# Patient Record
Sex: Female | Born: 1970 | Race: White | Hispanic: No | Marital: Married | Smoking: Never smoker
Health system: Southern US, Community
[De-identification: ages and names within clinical notes are randomized; demographics above are authoritative.]

## PROBLEM LIST (undated history)

## (undated) DIAGNOSIS — O09219 Supervision of pregnancy with history of pre-term labor, unspecified trimester: Secondary | ICD-10-CM

## (undated) DIAGNOSIS — R011 Cardiac murmur, unspecified: Secondary | ICD-10-CM

## (undated) DIAGNOSIS — I519 Heart disease, unspecified: Secondary | ICD-10-CM

## (undated) HISTORY — DX: Heart disease, unspecified: I51.9

## (undated) HISTORY — PX: TONSILLECTOMY: SUR1361

## (undated) HISTORY — DX: Cardiac murmur, unspecified: R01.1

## (undated) HISTORY — PX: MOHS SURGERY: SHX181

## (undated) HISTORY — PX: HEMORROIDECTOMY: SUR656

## (undated) HISTORY — DX: Supervision of pregnancy with history of pre-term labor, unspecified trimester: O09.219

---
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
## 1997-05-28 ENCOUNTER — Inpatient Hospital Stay (HOSPITAL_COMMUNITY): Admission: AD | Admit: 1997-05-28 | Discharge: 1997-05-30 | Payer: Self-pay | Admitting: Obstetrics & Gynecology

## 1997-06-19 ENCOUNTER — Other Ambulatory Visit: Admission: RE | Admit: 1997-06-19 | Discharge: 1997-06-19 | Payer: Self-pay | Admitting: Obstetrics and Gynecology

## 1997-11-03 ENCOUNTER — Other Ambulatory Visit: Admission: RE | Admit: 1997-11-03 | Discharge: 1997-11-03 | Payer: Self-pay | Admitting: Obstetrics and Gynecology

## 1998-10-03 ENCOUNTER — Other Ambulatory Visit: Admission: RE | Admit: 1998-10-03 | Discharge: 1998-10-03 | Payer: Self-pay | Admitting: *Deleted

## 1999-10-10 ENCOUNTER — Other Ambulatory Visit: Admission: RE | Admit: 1999-10-10 | Discharge: 1999-10-10 | Payer: Self-pay | Admitting: *Deleted

## 2002-02-28 ENCOUNTER — Other Ambulatory Visit: Admission: RE | Admit: 2002-02-28 | Discharge: 2002-02-28 | Payer: Self-pay | Admitting: Obstetrics and Gynecology

## 2002-04-26 ENCOUNTER — Encounter: Payer: Self-pay | Admitting: Obstetrics and Gynecology

## 2002-04-26 ENCOUNTER — Ambulatory Visit (HOSPITAL_COMMUNITY): Admission: RE | Admit: 2002-04-26 | Discharge: 2002-04-26 | Payer: Self-pay | Admitting: Obstetrics and Gynecology

## 2002-09-22 ENCOUNTER — Inpatient Hospital Stay (HOSPITAL_COMMUNITY): Admission: AD | Admit: 2002-09-22 | Discharge: 2002-09-25 | Payer: Self-pay | Admitting: Obstetrics and Gynecology

## 2003-03-06 ENCOUNTER — Other Ambulatory Visit: Admission: RE | Admit: 2003-03-06 | Discharge: 2003-03-06 | Payer: Self-pay | Admitting: Obstetrics and Gynecology

## 2003-05-09 ENCOUNTER — Emergency Department (HOSPITAL_COMMUNITY): Admission: EM | Admit: 2003-05-09 | Discharge: 2003-05-09 | Payer: Self-pay | Admitting: Family Medicine

## 2003-10-03 ENCOUNTER — Encounter: Admission: RE | Admit: 2003-10-03 | Discharge: 2003-10-03 | Payer: Self-pay | Admitting: Family Medicine

## 2004-11-13 ENCOUNTER — Other Ambulatory Visit: Admission: RE | Admit: 2004-11-13 | Discharge: 2004-11-13 | Payer: Self-pay | Admitting: Obstetrics and Gynecology

## 2005-12-05 ENCOUNTER — Other Ambulatory Visit: Admission: RE | Admit: 2005-12-05 | Discharge: 2005-12-05 | Payer: Self-pay | Admitting: Obstetrics and Gynecology

## 2007-02-05 ENCOUNTER — Emergency Department (HOSPITAL_COMMUNITY): Admission: EM | Admit: 2007-02-05 | Discharge: 2007-02-05 | Payer: Self-pay | Admitting: Family Medicine

## 2009-09-27 ENCOUNTER — Ambulatory Visit: Payer: Self-pay | Admitting: Family Medicine

## 2010-04-11 NOTE — Assessment & Plan Note (Signed)
Summary: UTI   Vital Signs:  Patient Profile:   40 Years Old Female CC:      POSSIBLE UTI / rwt Height:     68 inches Weight:      181 pounds BMI:     27.62 O2 Sat:      99 % O2 treatment:    Room Air Temp:     97.5 degrees F oral Pulse rate:   88 / minute Pulse (ortho):   88 / minute Pulse rhythm:   regular Resp:     20 per minute BP sitting:   132 / 71  (right arm)  Pt. in pain?   yes    Location:   pelvis    Intensity:   7    Type:       aching  Vitals Entered By: Levonne Spiller EMT-P (September 27, 2009 11:58 AM)              Is Patient Diabetic? No      Current Allergies: No known allergies History of Present Illness History from: patient Reason for visit: see chief complaint Chief Complaint: POSSIBLE UTI / rwt History of Present Illness: Patient reports that she woke up yesterday with pain upon urination. She denies frequent UTIs. Denies back pain, f/c, n/v. + some suprapubic pressure. Has been taking AZO with some relief of pain. Has not taken it this morning.   REVIEW OF SYSTEMS Constitutional Symptoms      Denies fever, chills, night sweats, and fatigue.       Gastrointestinal       Denies stomach pain, nausea/vomiting, diarrhea, and constipation. Genitourniary       Complains of painful urination.      Denies blood or discharge from vagina and loss of urinary control.    Past History:  Past Medical History: Skin cancer, hx of  Past Surgical History: ASD repair  Family History: Father: D93 Mother: A70 afib Siblings: Brother 105 - MI  Sister 56 - skin cancer Physical Exam General appearance: well developed, well nourished, uncomfortable Chest/Lungs: no rales, wheezes, or rhonchi bilateral, breath sounds equal without effort Heart: regular rate and  rhythm, no murmur Abdomen: + suprapubic tenderness Back: no CVA tenderness Skin: no obvious rashes or lesions MSE: oriented to time, place, and person Assessment New Problems: UTI (ICD-599.0) SKIN  CANCER, HX OF (ICD-V10.83)   Plan New Medications/Changes: BACTRIM DS 800-160 MG TABS (SULFAMETHOXAZOLE-TRIMETHOPRIM) 1 by mouth two times a day x 5 days  #10 x 0, 09/27/2009, Tacey Ruiz MD  New Orders: New Patient Level II [13086] Urinalysis [81003-65000] Specimen Handling [99000]  The patient and/or caregiver has been counseled thoroughly with regard to medications prescribed including dosage, schedule, interactions, rationale for use, and possible side effects and they verbalize understanding.  Diagnoses and expected course of recovery discussed and will return if not improved as expected or if the condition worsens. Patient and/or caregiver verbalized understanding.  Prescriptions: BACTRIM DS 800-160 MG TABS (SULFAMETHOXAZOLE-TRIMETHOPRIM) 1 by mouth two times a day x 5 days  #10 x 0   Entered and Authorized by:   Tacey Ruiz MD   Signed by:   Tacey Ruiz MD on 09/27/2009   Method used:   Electronically to        [facility] (retail)       [address], [address]       [place], [place]  [address]  Ph: [phone]       Fax: [phone]   RxID:   [id number]   Orders Added: 1)  New Patient Level II [99202] 2)  Urinalysis [81003-65000] 3)  Specimen Handling [99000]

## 2010-07-26 NOTE — H&P (Signed)
   Kathleen Bowen, Kathleen Bowen                         ACCOUNT NO.:  [id number]   MEDICAL RECORD NO.:  [id number]                   PATIENT TYPE:  INP   LOCATION:  9166                                 FACILITY:  [facility]   PHYSICIAN:  Crist Fat. Rivard, M.D.              DATE OF BIRTH:  [birth year]   DATE OF ADMISSION:  09/22/2002  DATE OF DISCHARGE:                                HISTORY & PHYSICAL   HISTORY OF PRESENT ILLNESS:  This is a 40 year old gravida 3, para 1-0-1-1,  at 51 and 4/7 weeks' who presents with complaints of increased uterine  contractions.  She denies leaking or bleeding.  Reports positive fetal  movement.  Cervix was 4 cm in the office today.  Pregnancy has been followed  by the nurse midwife service and is remarkable for:  1. History of preterm labor with full term delivery.  2. Preterm labor this pregnancy.  3. History of ASD repair.  4. History of thrombocytopenia.  5. Group B strep negative.   OBSTETRICAL HISTORY:  Remarkable for a vacuum-assisted vaginal delivery in  1999 of a female infant at 8 weeks' gestation weighing 8 pounds 6 ounces  complicated by a partial third-degree laceration and preterm labor.  She had  a spontaneous abortion in May of 2003.   PAST MEDICAL HISTORY:  Remarkable for preterm labor.  Child had varicella.  History of ASD repair.   PAST SURGICAL HISTORY:  Remarkable for ASD repair in 1992 and a  tonsillectomy at age 68.   GENETIC HISTORY:  Remarkable for ASD and a daughter with low platelets.   SOCIAL HISTORY:  The patient is married to Lindzee Gouge who is involved in  support issues of the [employer].  She denies any alcohol, tobacco, or  drug use.  She works as a Designer, jewellery in the NICU.   OBJECTIVE DATA:  VITAL SIGNS:  Stable.  Afebrile.  HEENT:  Within normal limits.  NECK:  Thyroid normal, not enlarged.  CHEST:  Clear to auscultation.  HEART:  Regular rate and rhythm.  ABDOMEN:  Gravid at 39 cm, vertex.  EFM shows a  reactive fetal heart rate  with uterine contractions every 4-5 minutes.  Cervical exam was 5 cm per RN.  EXTREMITIES:  Within normal limits.   ASSESSMENT:  1. Intrauterine pregnancy at 86 and 4/7 weeks'.  2. Active labor.  3. Group B strep negative.    PLAN:  1. Admit to birthing suite, Dr. Pennie Rushing notified.  2. Routine C.N.M. orders.  3. Consider amniotomy p.r.n.     Marie L. Williams, C.N.M.                 Crist Fat Rivard, M.D.    MLW/MEDQ  D:  09/22/2002  T:  09/22/2002  Job:  [id number]

## 2010-09-01 ENCOUNTER — Inpatient Hospital Stay (INDEPENDENT_AMBULATORY_CARE_PROVIDER_SITE_OTHER)
Admission: RE | Admit: 2010-09-01 | Discharge: 2010-09-01 | Disposition: A | Discharge status: Home / Self Care | Payer: Self-pay | Source: Ambulatory Visit | Attending: Emergency Medicine | Admitting: Emergency Medicine

## 2010-09-01 ENCOUNTER — Ambulatory Visit (INDEPENDENT_AMBULATORY_CARE_PROVIDER_SITE_OTHER): Payer: BC Managed Care – PPO

## 2010-09-01 DIAGNOSIS — L02419 Cutaneous abscess of limb, unspecified: Secondary | ICD-10-CM

## 2010-09-01 DIAGNOSIS — L03119 Cellulitis of unspecified part of limb: Secondary | ICD-10-CM

## 2010-09-03 ENCOUNTER — Emergency Department (HOSPITAL_COMMUNITY): Payer: BC Managed Care – PPO

## 2010-09-03 ENCOUNTER — Observation Stay (HOSPITAL_COMMUNITY)
Admission: EM | Admit: 2010-09-03 | Discharge: 2010-09-04 | Disposition: A | Discharge status: Home / Self Care | Payer: BC Managed Care – PPO | Source: Ambulatory Visit | Attending: Emergency Medicine | Admitting: Emergency Medicine

## 2010-09-03 ENCOUNTER — Inpatient Hospital Stay (INDEPENDENT_AMBULATORY_CARE_PROVIDER_SITE_OTHER)
Admission: RE | Admit: 2010-09-03 | Discharge: 2010-09-03 | Disposition: A | Discharge status: Home / Self Care | Payer: BC Managed Care – PPO | Source: Ambulatory Visit | Attending: Family Medicine | Admitting: Family Medicine

## 2010-09-03 DIAGNOSIS — M7989 Other specified soft tissue disorders: Secondary | ICD-10-CM | POA: Insufficient documentation

## 2010-09-03 DIAGNOSIS — L02419 Cutaneous abscess of limb, unspecified: Secondary | ICD-10-CM

## 2010-09-03 DIAGNOSIS — L03119 Cellulitis of unspecified part of limb: Secondary | ICD-10-CM

## 2011-06-12 ENCOUNTER — Other Ambulatory Visit: Payer: Self-pay | Admitting: Obstetrics and Gynecology

## 2011-06-12 ENCOUNTER — Other Ambulatory Visit (INDEPENDENT_AMBULATORY_CARE_PROVIDER_SITE_OTHER): Payer: BC Managed Care – PPO

## 2011-06-12 DIAGNOSIS — N920 Excessive and frequent menstruation with regular cycle: Secondary | ICD-10-CM

## 2012-02-18 ENCOUNTER — Ambulatory Visit: Payer: BC Managed Care – PPO | Admitting: Obstetrics and Gynecology

## 2012-03-11 ENCOUNTER — Encounter: Payer: Self-pay | Admitting: Obstetrics and Gynecology

## 2012-03-16 ENCOUNTER — Encounter: Payer: Self-pay | Admitting: Obstetrics and Gynecology

## 2012-03-16 ENCOUNTER — Ambulatory Visit (INDEPENDENT_AMBULATORY_CARE_PROVIDER_SITE_OTHER): Payer: BC Managed Care – PPO | Admitting: Obstetrics and Gynecology

## 2012-03-16 VITALS — BP 110/64 | HR 64 | Ht 68.0 in | Wt 185.0 lb

## 2012-03-16 DIAGNOSIS — Z309 Encounter for contraceptive management, unspecified: Secondary | ICD-10-CM

## 2012-03-16 DIAGNOSIS — IMO0001 Reserved for inherently not codable concepts without codable children: Secondary | ICD-10-CM

## 2012-03-16 DIAGNOSIS — Z124 Encounter for screening for malignant neoplasm of cervix: Secondary | ICD-10-CM

## 2012-03-16 DIAGNOSIS — Z01419 Encounter for gynecological examination (general) (routine) without abnormal findings: Secondary | ICD-10-CM

## 2012-03-16 LAB — POCT URINE PREGNANCY: Preg Test, Ur: NEGATIVE

## 2012-03-16 MED ORDER — NORETHIN ACE-ETH ESTRAD-FE 1-20 MG-MCG(24) PO TABS: 1.0000 | ORAL_TABLET | Freq: Every day | ORAL | Status: DC

## 2012-03-16 NOTE — Progress Notes (Signed)
Subjective:    Kathleen Bowen is a 42 y.o. female, [id number], who presents for an annual exam. The patient reports irregular menses-skips periods on Loestrin 24 Fe.  Denies missed pills or interfering medications.  Menstrual cycle:   LMP: Patient's last menstrual period was 02/15/2012.             Review of Systems Pertinent items are noted in HPI. Denies pelvic pain, urinary tract symptoms, vaginitis symptoms, irregular bleeding, menopausal symptoms, change in bowel habits or rectal bleeding   Objective:    BP 110/64  Pulse 64  Ht 5\' 8"  (1.727 m)  Wt 185 lb (83.915 kg)  BMI 28.13 kg/m2  LMP 02/15/2012   Wt Readings from Last 1 Encounters:  03/16/12 185 lb (83.915 kg)   Body mass index is 28.13 kg/(m^2). General Appearance: Alert, no acute distress HEENT: Grossly normal Neck / Thyroid: Supple, no thyromegaly or cervical adenopathy Lungs: Clear to auscultation bilaterally Back: No CVA tenderness Breast Exam: No dominant masses (fibrocystic breast changes) or nodes.No dimpling, nipple retraction or discharge. Cardiovascular: Regular rate and rhythm.  Gastrointestinal: Soft, non-tender, no masses or organomegaly Pelvic Exam: EGBUS-wnl, vagina-normal rugae, cervix- without lesions or tenderness, uterus appears normal size shape and consistency, adnexae-no masses or tenderness Lymphatic Exam: Non-palpable nodes in neck, clavicular,  axillary, or inguinal regions  Skin: no rashes or abnormalities Extremities: no clubbing cyanosis or edema  Neurologic: grossly normal Psychiatric: Alert and oriented  UPT: negative   Assessment:   Routine GYN Exam   Plan:  Loestrin 24 Fe #3 cycles  1 po qd 4 refills  PAP sent  RTO 1 year or prn  Nyaira Hodgens,ELMIRAPA-C

## 2012-03-16 NOTE — Progress Notes (Signed)
Regular Periods: no COME WHEN THEY WANT TO Mammogram: no  Monthly Breast Ex.: no Exercise: yes 3 DAYS A WEEK  Tetanus < 10 years: yes Seatbelts: yes  NI. Bladder Functn.: yes Abuse at home: no  Daily BM's: no Stressful Work: yes  Healthy Diet: yes Sigmoid-Colonoscopy: NO  Calcium: no Medical problems this year: DISCUSS CYCLE   LAST PAP:12/12  ASCUS; HPV NOT DETECTED  Contraception: LOESTRIN 24  Mammogram:  N0  PCP: [facility]  PMH: NO CHANGE  FMH: NO CHANGE  Last Bone Scan: NO   PT IS MARRIED.

## 2012-03-18 LAB — PAP IG W/ RFLX HPV ASCU

## 2012-04-04 ENCOUNTER — Ambulatory Visit (INDEPENDENT_AMBULATORY_CARE_PROVIDER_SITE_OTHER): Payer: BC Managed Care – PPO | Admitting: Emergency Medicine

## 2012-04-04 ENCOUNTER — Ambulatory Visit: Payer: BC Managed Care – PPO

## 2012-04-04 VITALS — BP 120/60 | HR 57 | Temp 98.7°F | Resp 16 | Ht 68.0 in | Wt 184.4 lb

## 2012-04-04 DIAGNOSIS — S022XXA Fracture of nasal bones, initial encounter for closed fracture: Secondary | ICD-10-CM

## 2012-04-04 DIAGNOSIS — S1093XA Contusion of unspecified part of neck, initial encounter: Secondary | ICD-10-CM

## 2012-04-04 DIAGNOSIS — S0033XA Contusion of nose, initial encounter: Secondary | ICD-10-CM

## 2012-04-04 DIAGNOSIS — S0003XA Contusion of scalp, initial encounter: Secondary | ICD-10-CM

## 2012-04-04 DIAGNOSIS — S0031XA Abrasion of nose, initial encounter: Secondary | ICD-10-CM

## 2012-04-04 DIAGNOSIS — Z23 Encounter for immunization: Secondary | ICD-10-CM

## 2012-04-04 DIAGNOSIS — IMO0002 Reserved for concepts with insufficient information to code with codable children: Secondary | ICD-10-CM

## 2012-04-04 NOTE — Patient Instructions (Addendum)
Nasal Fracture  A nasal fracture is a break or crack in the bones of the nose. A minor break usually heals in a month. You often will receive black eyes from a nasal fracture. This is not a cause for concern. The black eyes will go away over 1 to 2 weeks.   DIAGNOSIS   Your caregiver may want to examine you if you are concerned about a fracture of the nose. X-rays of the nose may not show a nasal fracture even when one is present. Sometimes your caregiver must wait 1 to 5 days after the injury to re-check the nose for alignment and to take additional X-rays. Sometimes the caregiver must wait until the swelling has gone down.  TREATMENT  Minor fractures that have caused no deformity often do not require treatment. More serious fractures where bones are displaced may require surgery. This will take place after the swelling is gone. Surgery will stabilize and align the fracture.  HOME CARE INSTRUCTIONS    Put ice on the injured area.   Put ice in a plastic bag.   Place a towel between your skin and the bag.   Leave the ice on for 15 to 20 minutes, 3 to 4 times a day.   Take medications as directed by your caregiver.   Only take over-the-counter or prescription medicines for pain, discomfort, or fever as directed by your caregiver.   If your nose starts bleeding, squeeze the soft parts of the nose against the center wall while you are sitting in an upright position for 10 minutes.   Contact sports should be avoided for at least 3 to 4 weeks or as directed by your caregiver.  SEEK MEDICAL CARE IF:   Your pain increases or becomes severe.   You continue to have nosebleeds.   The shape of your nose does not return to normal within 5 days.   You have pus draining from the nose.  SEEK IMMEDIATE MEDICAL CARE IF:    You have bleeding from your nose that does not stop after 20 minutes of pinching the nostrils closed and keeping ice on the nose.   You have clear fluid draining from your nose.   You notice a  grape-like swelling on the dividing wall between the nostrils (septum). This is a collection of blood (hematoma) that must be drained to help prevent infection.   You have difficulty moving your eyes.   You have recurrent vomiting.  Document Released: 02/22/2000 Document Revised: 05/19/2011 Document Reviewed: 06/10/2010  [employer] Patient Information 2013 [employer].

## 2012-04-04 NOTE — Progress Notes (Signed)
[facility] [address] [place] [address] [phone]  Date:  04/04/2012   Name:  Kathleen Bowen   DOB:  01-03-1971   MRN:  [id number]  PCP:  Henreitta Leber, PA    Chief Complaint: Facial Injury   History of Present Illness:  Kathleen Bowen is a 42 y.o. very pleasant female patient who presents with the following:  Injured last night stepping into the bathtub when she slipped and fell.  Landed on her forehead and nose.  No LOC. No neuro or visual symptoms.  Has pain in the forehead on the right and the bridge of the nose. Had a significant nose bleed that was promptly controlled.  No neck pain.  Patient Active Problem List  Diagnosis  . SKIN CANCER, HX OF    Past Medical History  Diagnosis Date  . Heart disease   . Hx of PTL (preterm labor), current pregnancy   . Heart murmur     Past Surgical History  Procedure Date  . Asd repair 1992  . Tonsillectomy   . Mohs surgery     PRECANCEROUS SKIN REMOVED  . Hemorroidectomy     History  Substance Use Topics  . Smoking status: Never Smoker   . Smokeless tobacco: Never Used  . Alcohol Use: No    Family History  Problem Relation Age of Onset  . Skin cancer Sister   . Liver cancer Paternal Grandfather   . Alcoholism Paternal Grandfather   . Heart disease Mother     PACE MAKER  . Skin cancer Mother     ON NOSE  . Atrial fibrillation Mother   . Thrombophlebitis Daughter   . ADD / ADHD Daughter   . Hypertension Father   . Diabetes Paternal Grandmother   . Heart attack Maternal Grandmother   . Heart attack Brother   . ADD / ADHD Son     No Known Allergies  Medication list has been reviewed and updated.  Current Outpatient Prescriptions on File Prior to Visit  Medication Sig Dispense Refill  . Norethindrone Acetate-Ethinyl Estrad-FE (LOESTRIN 24 FE) 1-20 MG-MCG(24) tablet Take 1 tablet by mouth daily.  1 Package  11  . Norethindrone Acetate-Ethinyl Estrad-FE (LOESTRIN 24 FE)  1-20 MG-MCG(24) tablet Take 1 tablet by mouth daily.  3 Package  4    Review of Systems:  As per HPI, otherwise negative.    Physical Examination: Filed Vitals:   04/04/12 0925  BP: 120/60  Pulse: 57  Temp: 98.7 F (37.1 C)  Resp: 16   Filed Vitals:   04/04/12 0925  Height: 5\' 8"  (1.727 m)  Weight: 184 lb 6.4 oz (83.643 kg)   Body mass index is 28.04 kg/(m^2). Ideal Body Weight: Weight in (lb) to have BMI = 25: 164.1    GEN: WDWN, NAD, Non-toxic, Alert & Oriented x 3 HEENT: contusion right cheek and abrasion nose.  No deformity.  Tender bridge of nose, Normocephalic.  Ears and Nose: No external deformity.  TM negative.  No battle sign.  No epistaxis EXTR: No clubbing/cyanosis/edema NEURO: Normal gait.  PSYCH: Normally interactive. Conversant. Not depressed or anxious appearing.  Calm demeanor.    Assessment and Plan: Fractured nose Abrasion nose Follow up with plastics or ENT  Ice tylenol  Carmelina Dane, MD  [facility] reading (PRIMARY) by  Dr. Dareen Piano.  Fractured nose.

## 2013-10-17 ENCOUNTER — Encounter: Payer: Self-pay | Admitting: Internal Medicine

## 2013-10-17 ENCOUNTER — Ambulatory Visit (INDEPENDENT_AMBULATORY_CARE_PROVIDER_SITE_OTHER): Payer: BC Managed Care – PPO | Admitting: Internal Medicine

## 2013-10-17 VITALS — BP 118/68 | HR 71 | Ht 68.0 in | Wt 186.0 lb

## 2013-10-17 DIAGNOSIS — R42 Dizziness and giddiness: Secondary | ICD-10-CM

## 2013-10-17 DIAGNOSIS — R0602 Shortness of breath: Secondary | ICD-10-CM

## 2013-10-17 DIAGNOSIS — E785 Hyperlipidemia, unspecified: Secondary | ICD-10-CM

## 2013-10-17 LAB — BASIC METABOLIC PANEL
BUN: 11 mg/dL (ref 6–23)
CHLORIDE: 107 meq/L (ref 96–112)
CO2: 27 mEq/L (ref 19–32)
Calcium: 9.1 mg/dL (ref 8.4–10.5)
Creatinine, Ser: 0.9 mg/dL (ref 0.4–1.2)
GFR: 72.33 mL/min (ref >60.00)
Glucose, Bld: 87 mg/dL (ref 70–99)
POTASSIUM: 3.8 meq/L (ref 3.5–5.1)
SODIUM: 138 meq/L (ref 135–145)

## 2013-10-17 LAB — CBC WITH DIFFERENTIAL/PLATELET
BASOS ABS: 0 10*3/uL (ref 0.0–0.1)
BASOS PCT: 0.4 % (ref 0.0–3.0)
EOS ABS: 0.1 10*3/uL (ref 0.0–0.7)
Eosinophils Relative: 0.8 % (ref 0.0–5.0)
HCT: 42.4 % (ref 36.0–46.0)
Hemoglobin: 14.6 g/dL (ref 12.0–15.0)
LYMPHS PCT: 24.1 % (ref 12.0–46.0)
Lymphs Abs: 2.1 10*3/uL (ref 0.7–4.0)
MCHC: 34.5 g/dL (ref 30.0–36.0)
MCV: 92.5 fl (ref 78.0–100.0)
Monocytes Absolute: 0.5 10*3/uL (ref 0.1–1.0)
Monocytes Relative: 5.9 % (ref 3.0–12.0)
NEUTROS PCT: 68.8 % (ref 43.0–77.0)
Neutro Abs: 6.1 10*3/uL (ref 1.4–7.7)
PLATELETS: 179 10*3/uL (ref 150.0–400.0)
RBC: 4.58 Mil/uL (ref 3.87–5.11)
RDW: 12.7 % (ref 11.5–15.5)
WBC: 8.8 10*3/uL (ref 4.0–10.5)

## 2013-10-17 LAB — LIPID PANEL
CHOLESTEROL: 177 mg/dL (ref 0–200)
HDL: 40.4 mg/dL (ref >39.00)
LDL CALC: 112 mg/dL — AB (ref 0–99)
NonHDL: 136.6
TRIGLYCERIDES: 125 mg/dL (ref 0.0–149.0)
Total CHOL/HDL Ratio: 4
VLDL: 25 mg/dL (ref 0.0–40.0)

## 2013-10-17 LAB — TSH: TSH: 1.68 u[IU]/mL (ref 0.35–4.50)

## 2013-10-17 NOTE — Patient Instructions (Signed)
Your physician recommends that you continue on your current medications as directed. Please refer to the Current Medication list given to you today.  Your physician recommends that you return for lab work in: today  Your physician has requested that you have an echocardiogram. Echocardiography is a painless test that uses sound waves to create images of your heart. It provides your doctor with information about the size and shape of your heart and how well your heart's chambers and valves are working. This procedure takes approximately one hour. There are no restrictions for this procedure.  Your physician has recommended that you wear a 24 hour holter monitor. Holter monitors are medical devices that record the heart's electrical activity. Doctors most often use these monitors to diagnose arrhythmias. Arrhythmias are problems with the speed or rhythm of the heartbeat. The monitor is a small, portable device. You can wear one while you do your normal daily activities. This is usually used to diagnose what is causing palpitations/syncope (passing out).  Your physician recommends that you schedule a follow-up appointment in: to be determined

## 2013-10-17 NOTE — Progress Notes (Addendum)
HPI Patietn is a 43 yo who is self referred for continued care  She is s/p ASD repair in 1992   Dallie Piles)  She was last seen by T Stuckey around that time. She did well after.  Had 2 successful pregnancy/deliveries.  Has been very active up to past year  Exercised regularly on elliptical.  Stopped after 2014 because she wasn't losng wt This spring/summer was playing softball  Running to second base when she felt like she was hit by turch  SOB.   Noted at times pulse low at 50  Has had some dizziness with standing up.  Denies palpitations  No other spells like with softball but has not exercised No Known Allergies  Current Outpatient Prescriptions  Medication Sig Dispense Refill  . Norethindrone Acetate-Ethinyl Estrad-FE (LOESTRIN 24 FE) 1-20 MG-MCG(24) tablet Take 1 tablet by mouth daily.  3 Package  4   No current facility-administered medications for this visit.    Past Medical History  Diagnosis Date  . Heart disease   . Hx of PTL (preterm labor), current pregnancy   . Heart murmur     Past Surgical History  Procedure Laterality Date  . Asd repair  1992  . Tonsillectomy    . Mohs surgery      PRECANCEROUS SKIN REMOVED  . Hemorroidectomy      Family History  Problem Relation Age of Onset  . Skin cancer Sister   . Liver cancer Paternal Grandfather   . Alcoholism Paternal Grandfather   . Heart disease Mother     PACE MAKER  . Skin cancer Mother     ON NOSE  . Atrial fibrillation Mother   . Thrombophlebitis Daughter   . ADD / ADHD Daughter   . Hypertension Father   . Diabetes Paternal Grandmother   . Heart attack Maternal Grandmother   . Heart attack Brother   . ADD / ADHD Son     History   Social History  . Marital Status: Married    Spouse Name: N/A    Number of Children: N/A  . Years of Education: N/A   Occupational History  . Not on file.   Social History Main Topics  . Smoking status: Never Smoker   . Smokeless tobacco: Never Used  . Alcohol Use:  No  . Drug Use: No  . Sexual Activity: Yes    Birth Control/ Protection: Pill     Comment: LO ESTRIN 24     Other Topics Concern  . Not on file   Social History Narrative  . No narrative on file    Review of Systems:  All systems reviewed.  They are negative to the above problem except as previously stated.  Vital Signs: BP 118/68  Pulse 71  Ht 5' 8"  (1.727 m)  Wt 186 lb (84.369 kg)  BMI 28.29 kg/m2  Physical Exam Patient is in NAD HEENT:  Normocephalic, atraumatic. EOMI, PERRLA.  Neck: JVP is normal.  No bruits.  Lungs: clear to auscultation. No rales no wheezes.  Heart: Regular rate and rhythm. Normal S1, S2. No S3.   No significant murmurs. PMI not displaced.  Abdomen:  Supple, nontender. Normal bowel sounds. No masses. No hepatomegaly.  Extremities:   Good distal pulses throughout. No lower extremity edema.  Musculoskeletal :moving all extremities.  Neuro:   alert and oriented x3.  CN II-XII grossly intact.  EKG  SR 71 bpm   Assessment and Plan:  Patient is a 43 yo with  history of ASD (repaired)  Was very active up until this year.  Has had a spell that concerns her and some dizziness   Will set up for echo to evaluate LV as well as LA/RA   Would also set up for holter monitor Mothter with pacer.  I have asked her to be very active on day she wears device to watch chronotropic response  WIll set up for lipids today     Addendum (8/31)  Called patient with holter results.  HR 45 to 122 bpm  Avg 68 bpm  No pauses  Patient says she did not push herself with activity on those days.  Would recomm GXT to eval HR, BP response to exercise.     Dorris Carnes

## 2013-10-18 ENCOUNTER — Encounter (INDEPENDENT_AMBULATORY_CARE_PROVIDER_SITE_OTHER): Payer: BC Managed Care – PPO

## 2013-10-18 ENCOUNTER — Ambulatory Visit (HOSPITAL_COMMUNITY): Payer: BC Managed Care – PPO | Attending: Cardiology

## 2013-10-18 ENCOUNTER — Encounter: Payer: Self-pay | Admitting: Radiology

## 2013-10-18 DIAGNOSIS — R42 Dizziness and giddiness: Secondary | ICD-10-CM

## 2013-10-18 DIAGNOSIS — R0602 Shortness of breath: Secondary | ICD-10-CM | POA: Insufficient documentation

## 2013-10-18 NOTE — Progress Notes (Signed)
Patient ID: Kathleen Bowen, female   DOB: 08/01/1970, 43 y.o.   MRN: [id number] E cardio 24hr holter monitor applied

## 2013-10-18 NOTE — Progress Notes (Signed)
2D Echo completed. 10/18/2013

## 2013-10-21 ENCOUNTER — Telehealth: Payer: Self-pay | Admitting: Internal Medicine

## 2013-10-21 NOTE — Telephone Encounter (Signed)
Pt calling Dr Harrington Challenger and nurse to get recent echo and holter monitor results.  Informed the pt that per Dr Harrington Challenger , her echo revealed that her LV systolic function is normal, & there is mild diastolic dysfunction. Informed the pt that Dr Harrington Challenger stated she is convinced that this is leading to her symptoms.  Informed pt that per Dr Harrington Challenger her valves are functioning ok.  Informed pt that there are no changes prior to her other echo.  Informed the pt that Dr Harrington Challenger is waiting on holter monitor results. Informed the pt that after Dr Harrington Challenger reviews her holter results, someone from our office will follow-up with her thereafter.  Pt verbalized understanding and agrees with this plan.

## 2013-10-21 NOTE — Telephone Encounter (Signed)
New message     Want test/monitor results

## 2013-10-25 ENCOUNTER — Telehealth: Payer: Self-pay | Admitting: Internal Medicine

## 2013-10-25 NOTE — Telephone Encounter (Signed)
°  Patient would like results of Holter monitor, please call and advise.

## 2013-10-26 NOTE — Telephone Encounter (Signed)
Informed no results of holter monitor as of this time. We will be in touch once they are back and Dr. Harrington Challenger reviews. Verbalizes understanding.

## 2013-10-26 NOTE — Telephone Encounter (Signed)
New message    Patient calling back for test results.

## 2013-11-01 ENCOUNTER — Ambulatory Visit: Payer: BC Managed Care – PPO | Admitting: Cardiology

## 2013-11-07 ENCOUNTER — Other Ambulatory Visit: Payer: Self-pay | Admitting: *Deleted

## 2013-11-07 DIAGNOSIS — R42 Dizziness and giddiness: Secondary | ICD-10-CM

## 2013-11-28 ENCOUNTER — Telehealth: Payer: Self-pay | Admitting: Cardiology

## 2013-11-28 NOTE — Telephone Encounter (Signed)
New message  Pt called states that she is now [redacted] weeks pregnant. Requests a call back to determine if it is still safe for her to have the Exercise Tolerance test. She says she would like to proceed. However she is requesting a call back to determine if its ok. Please call

## 2013-11-28 NOTE — Telephone Encounter (Signed)
This is a Dr Harrington Challenger pt, so per Dr Meda Coffee will route this to appropriate nurse and MD for further review and recommendation.

## 2013-11-30 ENCOUNTER — Encounter: Payer: BC Managed Care – PPO | Admitting: Nurse Practitioner

## 2013-11-30 NOTE — Telephone Encounter (Signed)
Called pt around 10 to inform her that Truitt Merle, NP thought that she should still come for her GXT appt. No answer at home number and cell phone number stated pt was in class.

## 2013-12-01 NOTE — Telephone Encounter (Signed)
Late entry from 11/28/13.  Dr. Harrington Challenger and patient spoke on the phone. Advised patient not to have the GXT done at this time. Pt had not had the same episode again that she had in summer while playing softball. Wore holter which did not show anything per Dr. Harrington Challenger. Patient reported to Dr. Harrington Challenger feeling tachycardic and fatigued. Dr. Harrington Challenger discussed early pregnancy effects/symptoms with patient and told her to not have the GXT done at this time.

## 2014-01-09 ENCOUNTER — Encounter: Payer: Self-pay | Admitting: Internal Medicine

## 2014-05-03 ENCOUNTER — Telehealth: Payer: Self-pay

## 2014-05-03 NOTE — Telephone Encounter (Signed)
Patient left voicemail at 12:48pm requesting record of tdap shot she received Jan. 2014. [place] is requesting proof of immunization. Cb# [id number].

## 2014-05-05 NOTE — Telephone Encounter (Signed)
Spoke with patient. She will call back with the fax number to send tdap shot to [place] occupational nurse.

## 2014-05-08 NOTE — Telephone Encounter (Signed)
Patient left voicemail today at 8:54am with fax number for Kendall Flack at [facility] to fax tdap information. The fax number is [phone]. Documentation faxed with confirmation at 4:37pm.

## 2015-04-26 ENCOUNTER — Other Ambulatory Visit: Payer: Self-pay | Admitting: Obstetrics

## 2015-04-26 DIAGNOSIS — R928 Other abnormal and inconclusive findings on diagnostic imaging of breast: Secondary | ICD-10-CM

## 2015-05-08 ENCOUNTER — Other Ambulatory Visit: Payer: Self-pay

## 2015-05-08 ENCOUNTER — Other Ambulatory Visit: Payer: Self-pay | Admitting: Obstetrics

## 2015-05-08 ENCOUNTER — Ambulatory Visit
Admission: RE | Admit: 2015-05-08 | Discharge: 2015-05-08 | Disposition: A | Discharge status: Home / Self Care | Payer: BLUE CROSS/BLUE SHIELD | Source: Ambulatory Visit | Attending: Obstetrics | Admitting: Obstetrics

## 2015-05-08 DIAGNOSIS — R921 Mammographic calcification found on diagnostic imaging of breast: Secondary | ICD-10-CM

## 2015-05-08 DIAGNOSIS — R928 Other abnormal and inconclusive findings on diagnostic imaging of breast: Secondary | ICD-10-CM

## 2015-05-14 ENCOUNTER — Ambulatory Visit
Admission: RE | Admit: 2015-05-14 | Discharge: 2015-05-14 | Disposition: A | Discharge status: Home / Self Care | Payer: BLUE CROSS/BLUE SHIELD | Source: Ambulatory Visit | Attending: Obstetrics | Admitting: Obstetrics

## 2015-05-14 ENCOUNTER — Other Ambulatory Visit: Payer: Self-pay | Admitting: Obstetrics

## 2015-05-14 DIAGNOSIS — R921 Mammographic calcification found on diagnostic imaging of breast: Secondary | ICD-10-CM

## 2015-05-14 DIAGNOSIS — N6320 Unspecified lump in the left breast, unspecified quadrant: Secondary | ICD-10-CM

## 2015-05-14 HISTORY — PX: BREAST BIOPSY: SHX20

## 2015-10-16 ENCOUNTER — Other Ambulatory Visit: Payer: Self-pay | Admitting: Obstetrics

## 2015-10-16 DIAGNOSIS — R921 Mammographic calcification found on diagnostic imaging of breast: Secondary | ICD-10-CM

## 2015-10-17 ENCOUNTER — Ambulatory Visit (INDEPENDENT_AMBULATORY_CARE_PROVIDER_SITE_OTHER): Payer: BLUE CROSS/BLUE SHIELD | Admitting: Family Medicine

## 2015-10-17 VITALS — BP 122/80 | HR 64 | Temp 98.5°F | Resp 18 | Ht 68.0 in | Wt 192.0 lb

## 2015-10-17 DIAGNOSIS — J0101 Acute recurrent maxillary sinusitis: Secondary | ICD-10-CM

## 2015-10-17 DIAGNOSIS — J069 Acute upper respiratory infection, unspecified: Secondary | ICD-10-CM | POA: Diagnosis not present

## 2015-10-17 MED ORDER — PREDNISONE 20 MG PO TABS: ORAL_TABLET | ORAL | 0 refills | Status: DC

## 2015-10-17 MED ORDER — AMOXICILLIN 500 MG PO CAPS: 1000.0000 mg | ORAL_CAPSULE | Freq: Two times a day (BID) | ORAL | 0 refills | Status: DC

## 2015-10-17 NOTE — Patient Instructions (Addendum)
IF you received an x-ray today, you will receive an invoice from [facility]. Please contact [facility] at [phone] with questions or concerns regarding your invoice.   IF you received labwork today, you will receive an invoice from [employer]. Please contact [facility] at [phone] with questions or concerns regarding your invoice.   Our billing staff will not be able to assist you with questions regarding bills from these companies.  You will be contacted with the lab results as soon as they are available. The fastest way to get your results is to activate your My Chart account. Instructions are located on the last page of this paperwork. If you have not heard from [place] regarding the results in 2 weeks, please contact this office.     Sinusitis, Adult Sinusitis is redness, soreness, and inflammation of the paranasal sinuses. Paranasal sinuses are air pockets within the bones of your face. They are located beneath your eyes, in the middle of your forehead, and above your eyes. In healthy paranasal sinuses, mucus is able to drain out, and air is able to circulate through them by way of your nose. However, when your paranasal sinuses are inflamed, mucus and air can become trapped. This can allow bacteria and other germs to grow and cause infection. Sinusitis can develop quickly and last only a short time (acute) or continue over a long period (chronic). Sinusitis that lasts for more than 12 weeks is considered chronic. CAUSES Causes of sinusitis include:  Allergies.  Structural abnormalities, such as displacement of the cartilage that separates your nostrils (deviated septum), which can decrease the air flow through your nose and sinuses and affect sinus drainage.  Functional abnormalities, such as when the small hairs (cilia) that line your sinuses and help remove mucus do not work properly or are not present. SIGNS AND SYMPTOMS Symptoms  of acute and chronic sinusitis are the same. The primary symptoms are pain and pressure around the affected sinuses. Other symptoms include:  Upper toothache.  Earache.  Headache.  Bad breath.  Decreased sense of smell and taste.  A cough, which worsens when you are lying flat.  Fatigue.  Fever.  Thick drainage from your nose, which often is green and may contain pus (purulent).  Swelling and warmth over the affected sinuses. DIAGNOSIS Your health care provider will perform a physical exam. During your exam, your health care provider may perform any of the following to help determine if you have acute sinusitis or chronic sinusitis:  Look in your nose for signs of abnormal growths in your nostrils (nasal polyps).  Tap over the affected sinus to check for signs of infection.  View the inside of your sinuses using an imaging device that has a light attached (endoscope). If your health care provider suspects that you have chronic sinusitis, one or more of the following tests may be recommended:  Allergy tests.  Nasal culture. A sample of mucus is taken from your nose, sent to a lab, and screened for bacteria.  Nasal cytology. A sample of mucus is taken from your nose and examined by your health care provider to determine if your sinusitis is related to an allergy. TREATMENT Most cases of acute sinusitis are related to a viral infection and will resolve on their own within 10 days. Sometimes, medicines are prescribed to help relieve symptoms of both acute and chronic sinusitis. These may include pain medicines, decongestants, nasal steroid sprays, or saline sprays. However, for sinusitis related  to a bacterial infection, your health care provider will prescribe antibiotic medicines. These are medicines that will help kill the bacteria causing the infection. Rarely, sinusitis is caused by a fungal infection. In these cases, your health care provider will prescribe antifungal  medicine. For some cases of chronic sinusitis, surgery is needed. Generally, these are cases in which sinusitis recurs more than 3 times per year, despite other treatments. HOME CARE INSTRUCTIONS  Drink plenty of water. Water helps thin the mucus so your sinuses can drain more easily.  Use a humidifier.  Inhale steam 3-4 times a day (for example, sit in the bathroom with the shower running).  Apply a warm, moist washcloth to your face 3-4 times a day, or as directed by your health care provider.  Use saline nasal sprays to help moisten and clean your sinuses.  Take medicines only as directed by your health care provider.  If you were prescribed either an antibiotic or antifungal medicine, finish it all even if you start to feel better. SEEK IMMEDIATE MEDICAL CARE IF:  You have increasing pain or severe headaches.  You have nausea, vomiting, or drowsiness.  You have swelling around your face.  You have vision problems.  You have a stiff neck.  You have difficulty breathing.   This information is not intended to replace advice given to you by your health care provider. Make sure you discuss any questions you have with your health care provider.   Document Released: 02/24/2005 Document Revised: 03/17/2014 Document Reviewed: 03/11/2011 Elsevier Interactive Patient Education [employer].

## 2015-10-17 NOTE — Progress Notes (Signed)
Patient ID: MIKAL WISMAN, female   DOB: 27-Jun-1970, 45 y.o.   MRN: [id number]   Subjective:  By signing my name below, I, Moises Blood, attest that this documentation has been prepared under the direction and in the presence of Reginia Forts, MD. Electronically Signed: Moises Blood, [place]. 10/17/2015 , 5:50 PM .  Patient was seen in Room 1 .   Patient ID: SANDER SPECKMAN, female    DOB: 1970/03/19, 45 y.o.   MRN: [id number]  10/17/2015  Sinusitis  HPI Patient is here complaining of sinus pressure with head pressure and ear pain that started 5 days ago. She states her hearing is muffled and has had rhinorrhea with green mucus. She also notes waking up at night due to coughing spells. She has been taking chloraseptic lozenges without relief. She states her husband had similar symptoms a week prior and was treated with prednisone taper and Amoxicillin. She denies seasonal or environmental allergies. She denies fever, chills, sweats or shortness of breath. She denies recent antibiotic use.   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   Her paternal grandmother had diabetes.   Review of Systems  Constitutional: Positive for fatigue. Negative for chills, diaphoresis and fever.  HENT: Positive for congestion, ear pain, hearing loss, postnasal drip, rhinorrhea, sinus pressure, sore throat and voice change. Negative for ear discharge, mouth sores and trouble swallowing.   Respiratory: Positive for cough. Negative for shortness of breath and wheezing.   Gastrointestinal: Negative for diarrhea, nausea and vomiting.  Allergic/Immunologic: Negative for environmental allergies and immunocompromised state.  Neurological: Positive for headaches. Negative for dizziness, tremors, seizures, syncope, facial asymmetry, speech difficulty, weakness, light-headedness and numbness.    Past Medical History:  Diagnosis Date  . Heart disease   . Heart murmur    . Hx of PTL (preterm labor), current pregnancy    Past Surgical History:  Procedure Laterality Date  . ASD REPAIR  1992  . HEMORROIDECTOMY    . MOHS SURGERY     PRECANCEROUS SKIN REMOVED  . TONSILLECTOMY     No Known Allergies  Social History   Social History  . Marital status: Married    Spouse name: N/A  . Number of children: N/A  . Years of education: N/A   Occupational History  . Not on file.   Social History Main Topics  . Smoking status: Never Smoker  . Smokeless tobacco: Never Used  . Alcohol use No  . Drug use: No  . Sexual activity: Yes    Birth control/ protection: Pill     Comment: LO ESTRIN 26     Other Topics Concern  . Not on file   Social History Narrative  . No narrative on file   Family History  Problem Relation Age of Onset  . Skin cancer Sister   . Liver cancer Paternal Grandfather   . Alcoholism Paternal Grandfather   . Heart disease Mother     PACE MAKER  . Skin cancer Mother     ON NOSE  . Atrial fibrillation Mother   . Thrombophlebitis Daughter   . ADD / ADHD Daughter   . Hypertension Father   . Diabetes Paternal Grandmother   . Heart attack Maternal Grandmother   . Heart attack Brother   . ADD / ADHD Son        Objective:    BP 122/80 (BP Location: Right Arm, Patient Position: Sitting, Cuff Size:  Small)   Pulse 64   Temp 98.5 F (36.9 C) (Oral)   Resp 18   Ht 5' 8"  (1.727 m)   Wt 192 lb (87.1 kg)   SpO2 97%   BMI 29.19 kg/m   Physical Exam  Constitutional: She is oriented to person, place, and time. She appears well-developed and well-nourished. No distress.  HENT:  Head: Normocephalic and atraumatic.  Right Ear: External ear and ear canal normal. Tympanic membrane is bulging.  Left Ear: External ear and ear canal normal. Tympanic membrane is bulging.  Nose: Mucosal edema and rhinorrhea present. Right sinus exhibits maxillary sinus tenderness. Right sinus exhibits no frontal sinus tenderness. Left sinus exhibits  maxillary sinus tenderness. Left sinus exhibits no frontal sinus tenderness.  Mouth/Throat: Uvula is midline, oropharynx is clear and moist and mucous membranes are normal.  Lots of drainage in post oropharynx  Eyes: Conjunctivae and EOM are normal. Pupils are equal, round, and reactive to light.  Neck: Normal range of motion. Neck supple. No thyromegaly present.  Cardiovascular: Normal rate, regular rhythm and normal heart sounds.  Exam reveals no gallop and no friction rub.   No murmur heard. Pulmonary/Chest: Effort normal and breath sounds normal. No respiratory distress. She has no wheezes. She has no rales.  Musculoskeletal: Normal range of motion.  Lymphadenopathy:    She has no cervical adenopathy.  Neurological: She is alert and oriented to person, place, and time.  Skin: Skin is warm and dry. She is not diaphoretic.  Psychiatric: She has a normal mood and affect. Her behavior is normal.  Nursing note and vitals reviewed.       Assessment & Plan:   1. Viral URI   2. Acute recurrent maxillary sinusitis     No orders of the defined types were placed in this encounter.  Meds ordered this encounter  Medications  . norethindrone-ethinyl estradiol (JUNEL FE,GILDESS FE,LOESTRIN FE) 1-20 MG-MCG tablet    Sig: Take 1 tablet by mouth daily.  Marland Kitchen DISCONTD: amoxicillin (AMOXIL) 500 MG capsule    Sig: Take 2 capsules (1,000 mg total) by mouth 2 (two) times daily.    Dispense:  40 capsule    Refill:  0  . DISCONTD: predniSONE (DELTASONE) 20 MG tablet    Sig: Three tablets daily x 2 days then two tablets daily x 5 days then one tablet daily x 5 days    Dispense:  21 tablet    Refill:  0    No Follow-up on file.    I personally performed the services described in this documentation, which was scribed in my presence. The recorded information has been reviewed and considered.  Kristi Elayne Guerin, M.D. [facility] [address] [place], [place]   [address] [phone] phone [phone] fax

## 2015-10-30 ENCOUNTER — Ambulatory Visit (INDEPENDENT_AMBULATORY_CARE_PROVIDER_SITE_OTHER): Payer: BLUE CROSS/BLUE SHIELD | Admitting: Family Medicine

## 2015-10-30 VITALS — BP 102/66 | HR 78 | Temp 97.7°F | Resp 18 | Ht 68.0 in | Wt 158.0 lb

## 2015-10-30 DIAGNOSIS — J329 Chronic sinusitis, unspecified: Secondary | ICD-10-CM

## 2015-10-30 DIAGNOSIS — R0981 Nasal congestion: Secondary | ICD-10-CM

## 2015-10-30 DIAGNOSIS — D72829 Elevated white blood cell count, unspecified: Secondary | ICD-10-CM

## 2015-10-30 DIAGNOSIS — J029 Acute pharyngitis, unspecified: Secondary | ICD-10-CM | POA: Diagnosis not present

## 2015-10-30 DIAGNOSIS — R059 Cough, unspecified: Secondary | ICD-10-CM

## 2015-10-30 DIAGNOSIS — R05 Cough: Secondary | ICD-10-CM

## 2015-10-30 LAB — POCT CBC
GRANULOCYTE PERCENT: 86.8 % — AB (ref 37–80)
HEMATOCRIT: 42.7 % (ref 37.7–47.9)
Hemoglobin: 15.5 g/dL (ref 12.2–16.2)
Lymph, poc: 2.4 (ref 0.6–3.4)
MCH: 32.4 pg — AB (ref 27–31.2)
MCHC: 36.3 g/dL — AB (ref 31.8–35.4)
MCV: 89.4 fL (ref 80–97)
MID (cbc): 0.2 (ref 0–0.9)
MPV: 8.4 fL (ref 0–99.8)
POC GRANULOCYTE: 16.9 — AB (ref 2–6.9)
POC LYMPH %: 12.1 % (ref 10–50)
POC MID %: 1.1 %M (ref 0–12)
Platelet Count, POC: 169 10*3/uL (ref 142–424)
RBC: 4.77 M/uL (ref 4.04–5.48)
RDW, POC: 12.6 %
WBC: 19.5 10*3/uL — AB (ref 4.6–10.2)

## 2015-10-30 LAB — POCT RAPID STREP A (OFFICE): Rapid Strep A Screen: NEGATIVE

## 2015-10-30 MED ORDER — LEVOFLOXACIN 500 MG PO TABS: 500.0000 mg | ORAL_TABLET | Freq: Every day | ORAL | 0 refills | Status: DC

## 2015-10-30 MED ORDER — FLUTICASONE PROPIONATE 50 MCG/ACT NA SUSP: 2.0000 | Freq: Every day | NASAL | 6 refills | Status: DC

## 2015-10-30 NOTE — Patient Instructions (Addendum)
Take Levaquin 500 mg 1 daily  Drink plenty of fluids and get sufficient rest  Use the fluticasone nose spray 2 sprays each nostril twice daily for 3 days, then once daily  Take Claritin-D or Allegra-D (loratadine or fexofenadine) for possible allergic component  If not improving by Friday and would recommend rechecking you, with a repeat CBC and consideration of getting a CT scan.  Return anytime if worse  IF you received an x-ray today, you will receive an invoice from [facility]. Please contact [facility] at [phone] with questions or concerns regarding your invoice.   IF you received labwork today, you will receive an invoice from [employer]. Please contact [facility] at [phone] with questions or concerns regarding your invoice.   Our billing staff will not be able to assist you with questions regarding bills from these companies.  You will be contacted with the lab results as soon as they are available. The fastest way to get your results is to activate your My Chart account. Instructions are located on the last page of this paperwork. If you have not heard from [place] regarding the results in 2 weeks, please contact this office.

## 2015-10-30 NOTE — Progress Notes (Signed)
Patient ID: Kathleen Bowen, female    DOB: [birth year]  Age: 45 y.o. MRN: [id number]  Chief Complaint  Patient presents with  . Sinusitis  . Headache  . 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 She has children of her own at home, from 10-20. She got sick several weeks ago with upper respiratory congestion and sore throat. She was seen and treated with prednisone and amoxicillin. She really did not improve completely however. Her throat, little better but the congestion and coughing persisted. She has a pressure sensation in her face and her ears bother her. She went to an ear nose and throat doctor and nothing remarkable found. She has now got a sore throat again. Apparently the illness had gone to the home with her husband and her child being sick. She does not smoke. She is not on regular medications otherwise.  Current allergies, medications, problem list, past/family and social histories reviewed.  Objective:  BP 102/66 (BP Location: Right Arm, Patient Position: Sitting, Cuff Size: Small)   Pulse 78   Temp 97.7 F (36.5 C) (Oral)   Resp 18   Ht 5' 8"  (1.727 m)   Wt 158 lb (71.7 kg)   SpO2 100%   BMI 24.02 kg/m   Not acutely ill-appearing. She does clear her throat some. Her TMs are normal. Sinuses nontender at this time but she has a deeper pressure sensation. Her throat is erythematous without exudate. Neck supple without significant nodes. Chest is clear to auscultation. Heart regular without murmur. She has a thoracotomy scar from having had a ASD repair when she was a senior in high school.  Assessment & Plan:   Assessment: 1. Sore throat   2. Sinus congestion   3. Rhinosinusitis   4. Cough   5. Leukocytosis       Plan: Check some labs including a strep test and throat culture if needed and a CBC and then decide treatment.   Orders Placed This Encounter  Procedures  . Culture, Group A Strep    Order Specific Question:    Source    Answer:   throat  . POCT rapid strep A  . POCT CBC    Meds ordered this encounter  Medications  . levofloxacin (LEVAQUIN) 500 MG tablet    Sig: Take 1 tablet (500 mg total) by mouth daily.    Dispense:  7 tablet    Refill:  0  . fluticasone (FLONASE) 50 MCG/ACT nasal spray    Sig: Place 2 sprays into both nostrils daily.    Dispense:  16 g    Refill:  6          Patient Instructions   Take Levaquin 500 mg 1 daily  Drink plenty of fluids and get sufficient rest  Use the fluticasone nose spray 2 sprays each nostril twice daily for 3 days, then once daily  Take Claritin-D or Allegra-D (loratadine or fexofenadine) for possible allergic component  If not improving by Friday and would recommend rechecking you, with a repeat CBC and consideration of getting a CT scan.  Return anytime if worse  IF you received an x-ray today, you will receive an invoice from [facility]. Please contact [facility] at [phone] with questions or concerns regarding your invoice.   IF you received labwork today, you will receive an invoice from [employer]. Please contact [facility] at [phone] with questions or concerns regarding your invoice.  Our billing staff will not be able to assist you with questions regarding bills from these companies.  You will be contacted with the lab results as soon as they are available. The fastest way to get your results is to activate your My Chart account. Instructions are located on the last page of this paperwork. If you have not heard from [place] regarding the results in 2 weeks, please contact this office.        No Follow-up on file.   Nykia Turko, MD 10/30/2015

## 2015-11-01 LAB — CULTURE, GROUP A STREP: ORGANISM ID, BACTERIA: NORMAL

## 2015-11-02 ENCOUNTER — Ambulatory Visit (INDEPENDENT_AMBULATORY_CARE_PROVIDER_SITE_OTHER): Payer: BLUE CROSS/BLUE SHIELD | Admitting: Family Medicine

## 2015-11-02 ENCOUNTER — Ambulatory Visit (INDEPENDENT_AMBULATORY_CARE_PROVIDER_SITE_OTHER): Payer: BLUE CROSS/BLUE SHIELD

## 2015-11-02 VITALS — BP 122/72 | HR 76 | Temp 98.5°F | Resp 17 | Ht 65.5 in | Wt 195.0 lb

## 2015-11-02 DIAGNOSIS — J0191 Acute recurrent sinusitis, unspecified: Secondary | ICD-10-CM

## 2015-11-02 DIAGNOSIS — H6983 Other specified disorders of Eustachian tube, bilateral: Secondary | ICD-10-CM

## 2015-11-02 LAB — POCT CBC
Granulocyte percent: 68.9 %G (ref 37–80)
HCT, POC: 42.6 % (ref 37.7–47.9)
HEMOGLOBIN: 15 g/dL (ref 12.2–16.2)
LYMPH, POC: 3 (ref 0.6–3.4)
MCH, POC: 31.7 pg — AB (ref 27–31.2)
MCHC: 35.2 g/dL (ref 31.8–35.4)
MCV: 90.1 fL (ref 80–97)
MID (cbc): 0.8 (ref 0–0.9)
MPV: 8.3 fL (ref 0–99.8)
PLATELET COUNT, POC: 186 10*3/uL (ref 142–424)
POC Granulocyte: 8.4 — AB (ref 2–6.9)
POC LYMPH PERCENT: 24.6 %L (ref 10–50)
POC MID %: 6.5 %M (ref 0–12)
RBC: 4.73 M/uL (ref 4.04–5.48)
RDW, POC: 12.6 %
WBC: 12.2 10*3/uL — AB (ref 4.6–10.2)

## 2015-11-02 MED ORDER — HYDROCOD POLST-CPM POLST ER 10-8 MG/5ML PO SUER: 5.0000 mL | Freq: Two times a day (BID) | ORAL | 0 refills | Status: DC | PRN

## 2015-11-02 MED ORDER — METHYLPREDNISOLONE SODIUM SUCC 125 MG IJ SOLR
125.0000 mg | Freq: Once | INTRAMUSCULAR | Status: AC
  Administered 2015-11-02: 125 mg via INTRAMUSCULAR

## 2015-11-02 NOTE — Patient Instructions (Signed)
     IF you received an x-ray today, you will receive an invoice from [facility]. Please contact [facility] at [phone] with questions or concerns regarding your invoice.   IF you received labwork today, you will receive an invoice from [facility]. Please contact [facility] at [phone] with questions or concerns regarding your invoice.   Our billing staff will not be able to assist you with questions regarding bills from these companies.  You will be contacted with the lab results as soon as they are available. The fastest way to get your results is to activate your My Chart account. Instructions are located on the last page of this paperwork. If you have not heard from us regarding the results in 2 weeks, please contact this office.      

## 2015-11-02 NOTE — Progress Notes (Signed)
By signing my name below I, Tereasa Coop, attest that this documentation has been prepared under the direction and in the presence of Delman Cheadle, MD. Electonically Signed. Tereasa Coop, Scribe 11/02/2015 at 6:04 PM  Subjective:    Patient ID: Kathleen Bowen, female    DOB: 01/11/1971, 45 y.o.   MRN: [id number]  Chief Complaint  Patient presents with  . Sinusitis  . URI  . Ear Pain    Sinusitis  Associated symptoms include congestion, coughing, ear pain and sinus pressure. Pertinent negatives include no shortness of breath.  URI   Associated symptoms include congestion, coughing and ear pain. Pertinent negatives include no abdominal pain, chest pain, dysuria or rash.   Kathleen Bowen is a 45 y.o. female who presents to the [facility] complaining of ear pain.  Note that this is the pt's 3rd visit to [facility] for same symptoms. Pt was initially see by Dr Tamala Julian 2 weeks prior. Dx with sinusitis and started on amoxicillin 1052m BID and prednisone 633mtaper over 12 days. Then seen again 3 days prior by Dr HoLinna DarnerHad some improvement at that time but sinus pressure and ear pain continued. Pt saw ENT who stated pt had normal exam. Symptoms then began to worsen, reflective of secondary infection, placed on levoquin 500 QD for a week. With flonase, antihistamine and sudafed. Was recommended to follow up at clinic if symptoms did not improve for sinus CT. Pt had CBC 3 days ago that showed WBC of 19.5 with left shift. Negative rapid strep, normal throat culture. Note that pt had finished her prednisone course 1-2 days prior to when CBC was drawn.   Pt has also been trying to use a neti pot and she reports increased pressure in her ears. Pt states she has been blowing her nose and 2 days ago her sinus discharge has changed from green to clear. Pt still reports ear pain and pressure, sinus congestion, and rhinorrhea. Symptoms equal bilat. Overall her symptoms are unchanged from 3  days prior besides the color change in her sinuses and sore throat has improved. Sinus pressure, ear pain, and sinus congestion has remained unchanged.  Pt denies any fever. Pt had a sore throat 3 days ago that has improved. Pt has had a productive cough. Pt has been having ear popping sensations. Pt reports that prednisone provided no relief. Pt denies any history of sinus problems. Pt is having trouble sleeping because she is waking up coughing every night.   Pt is using allegra D which provides mild temporary relief. Pt is using [id number].   Pt has lived in the same house for the past 20 years, no flooding in her house, no new job, no known mold exposure. Pt has lived with the same dog for the past 5 years.   Pt works as a nurMarine scientistd a schEducation officer, museum Pt denies any chance of pregnancy.    Patient Active Problem List   Diagnosis Date Noted  . SKIN CANCER, HX OF 09/27/2009    Current Outpatient Prescriptions on File Prior to Visit  Medication Sig Dispense Refill  . fluticasone (FLONASE) 50 MCG/ACT nasal spray Place 2 sprays into both nostrils daily. 16 g 6  . levofloxacin (LEVAQUIN) 500 MG tablet Take 1 tablet (500 mg total) by mouth daily. 7 tablet 0  . norethindrone-ethinyl estradiol (JUNEL FE,GILDESS FE,LOESTRIN FE) 1-20 MG-MCG tablet Take 1 tablet by mouth daily.     No current facility-administered  medications on file prior to visit.     No Known Allergies  Depression screen [facility] 2/9 11/02/2015 10/30/2015  Decreased Interest 0 0  Down, Depressed, Hopeless 0 0  PHQ - 2 Score 0 0       Review of Systems  Constitutional: Negative for fever.  HENT: Positive for congestion, ear pain and sinus pressure.   Eyes: Negative for visual disturbance.  Respiratory: Positive for cough. Negative for shortness of breath.   Cardiovascular: Negative for chest pain.  Gastrointestinal: Negative for abdominal pain.  Genitourinary: Negative for dysuria.  Musculoskeletal: Negative for  back pain.  Skin: Negative for rash.  Neurological: Negative for dizziness.  Psychiatric/Behavioral: Positive for sleep disturbance.       Objective:   Physical Exam  Constitutional: She is oriented to person, place, and time. She appears well-developed and well-nourished. No distress.  HENT:  Head: Normocephalic and atraumatic.  Eyes: Conjunctivae are normal. Pupils are equal, round, and reactive to light.  Neck: Neck supple.  Cardiovascular: Normal rate, regular rhythm and normal heart sounds.  Exam reveals no gallop and no friction rub.   No murmur heard. Pulmonary/Chest: Effort normal. No accessory muscle usage. She has decreased breath sounds. She has no wheezes. She has no rhonchi. She has no rales.  Musculoskeletal: Normal range of motion.  Lymphadenopathy:       Head (right side): Submandibular (mild) adenopathy present.       Head (left side): Submandibular (mild) adenopathy present.    She has cervical adenopathy (rt sided anterior.).  Neurological: She is alert and oriented to person, place, and time.  Skin: Skin is warm and dry.  Psychiatric: She has a normal mood and affect. Her behavior is normal.  Nursing note and vitals reviewed.   BP 122/72 (BP Location: Right Arm, Patient Position: Sitting, Cuff Size: Normal)   Pulse 76   Temp 98.5 F (36.9 C) (Oral)   Resp 17   Ht 5' 5.5" (1.664 m)   Wt 195 lb (88.5 kg)   SpO2 99%   BMI 31.96 kg/m    Results for orders placed or performed in visit on 11/02/15  POCT CBC  Result Value Ref Range   WBC 12.2 (A) 4.6 - 10.2 K/uL   Lymph, poc 3.0 0.6 - 3.4   POC LYMPH PERCENT 24.6 10 - 50 %L   MID (cbc) 0.8 0 - 0.9   POC MID % 6.5 0 - 12 %M   POC Granulocyte 8.4 (A) 2 - 6.9   Granulocyte percent 68.9 37 - 80 %G   RBC 4.73 4.04 - 5.48 M/uL   Hemoglobin 15.0 12.2 - 16.2 g/dL   HCT, POC 42.6 37.7 - 47.9 %   MCV 90.1 80 - 97 fL   MCH, POC 31.7 (A) 27 - 31.2 pg   MCHC 35.2 31.8 - 35.4 g/dL   RDW, POC 12.6 %   Platelet  Count, POC 186 142 - 424 K/uL   MPV 8.3 0 - 99.8 fL   Dg Sinuses Complete  Result Date: 11/02/2015 CLINICAL DATA:  Sinus congestion for a month. EXAM: PARANASAL SINUSES - COMPLETE 3 + VIEW COMPARISON:  None. FINDINGS: The paranasal sinus are aerated. There is no evidence of sinus opacification air-fluid levels or mucosal thickening. No significant bone abnormalities are seen. IMPRESSION: Negative. Electronically Signed   By: Kathreen Devoid   On: 11/02/2015 18:34       Assessment & Plan:   1. Acute recurrent sinusitis, unspecified location  2. Eustachian tube dysfunction, bilateral    Pt frustrated that she has had URI sxs for over 3 wks with minimal improvement after amox w/ pred taper or now on day 4 of levaquin.  However, reassuring that leukocytosis has sig improved from 19.8 -> 12 since starting levaquin along with clearing of mucous color and resolution of pharyngitis.  Exam and xray reassuring Try high dose solumedrol and increase netti pot use for the next sev d. Cont allegra-D and flonase. Try tussionex to hepl with sleep. If no improvement in sxs in 3d, will proceed with sinus CT and referral back to Dr. Lucia Gaskins - ENT.  If spt is getting improvement in sxs on Mon - 3d - but not resolved, call for an additional 1 wk refill of levaquin 500 and cancel sinus CT.  Orders Placed This Encounter  Procedures  . DG Sinuses Complete    Standing Status:   Future    Number of Occurrences:   1    Standing Expiration Date:   11/01/2016    Order Specific Question:   Reason for Exam (SYMPTOM  OR DIAGNOSIS REQUIRED)    Answer:   persistent sinus congestion for a month    Order Specific Question:   Is the patient pregnant?    Answer:   No    Order Specific Question:   Preferred imaging location?    Answer:   External  . Sedimentation Rate  . C-reactive protein  . POCT CBC    Meds ordered this encounter  Medications  . chlorpheniramine-HYDROcodone (TUSSIONEX PENNKINETIC ER) 10-8 MG/5ML  SUER    Sig: Take 5 mLs by mouth every 12 (twelve) hours as needed.    Dispense:  120 mL    Refill:  0  . methylPREDNISolone sodium succinate (SOLU-MEDROL) 125 mg/2 mL injection 125 mg    I personally performed the services described in this documentation, which was scribed in my presence. The recorded information has been reviewed and considered, and addended by me as needed.   Delman Cheadle, M.D.  [facility] [address] [place], [place] [address] [phone] phone [phone] fax  11/02/15 10:09 PM

## 2015-11-03 ENCOUNTER — Ambulatory Visit: Payer: BLUE CROSS/BLUE SHIELD

## 2015-11-03 LAB — C-REACTIVE PROTEIN: CRP: 3.1 mg/dL — AB (ref <0.60)

## 2015-11-04 LAB — SEDIMENTATION RATE: SED RATE: 21 mm/h — AB (ref 0–20)

## 2015-11-05 ENCOUNTER — Telehealth: Payer: Self-pay

## 2015-11-05 ENCOUNTER — Ambulatory Visit (HOSPITAL_BASED_OUTPATIENT_CLINIC_OR_DEPARTMENT_OTHER): Admission: RE | Admit: 2015-11-05 | Payer: BLUE CROSS/BLUE SHIELD | Source: Ambulatory Visit

## 2015-11-05 NOTE — Telephone Encounter (Signed)
Spoke with pt, she states she is feeling a little bit better but wants to proceed with the CT scan. She is still congested, not sleeping well, and coughing,

## 2015-11-05 NOTE — Telephone Encounter (Signed)
PATIENT STATES SHE SAW DR. SHAW ON Friday FOR SINUS AND CONGESTION. SHE DID SOME LAB WORK ON HER AND SHE WOULD LIKE TO GET THE RESULTS. DR. Brigitte Pulse ALSO WANTED HER TO HAVE A CT SCAN, BUT IT IS DEPENDENT ON THE OUTCOME OF HER LABS. IF HER LABS ARE NORMAL SHE MAY NOT NEED THE CT. SHE WOULD LIKE DR. SHAW TO CALL HER BEFORE HER APPOINTMENT IS MADE FOR THE CT. 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 IF AFTER 4:00 [phone] (CELL)  MBC

## 2015-11-05 NOTE — Telephone Encounter (Signed)
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 She has a mammogram scheduled on Sept 7 so not this day. I see an order in I believe.

## 2015-11-06 ENCOUNTER — Other Ambulatory Visit: Payer: Self-pay | Admitting: Family Medicine

## 2015-11-06 ENCOUNTER — Encounter (HOSPITAL_BASED_OUTPATIENT_CLINIC_OR_DEPARTMENT_OTHER): Payer: Self-pay

## 2015-11-06 ENCOUNTER — Ambulatory Visit (HOSPITAL_BASED_OUTPATIENT_CLINIC_OR_DEPARTMENT_OTHER)
Admission: RE | Admit: 2015-11-06 | Discharge: 2015-11-06 | Disposition: A | Discharge status: Home / Self Care | Payer: BLUE CROSS/BLUE SHIELD | Source: Ambulatory Visit | Attending: Family Medicine | Admitting: Family Medicine

## 2015-11-06 DIAGNOSIS — J0191 Acute recurrent sinusitis, unspecified: Secondary | ICD-10-CM

## 2015-11-06 NOTE — Telephone Encounter (Signed)
Ct was ordered on 8/25

## 2015-11-08 ENCOUNTER — Telehealth: Payer: Self-pay | Admitting: *Deleted

## 2015-11-08 NOTE — Telephone Encounter (Signed)
She still has a ton of acute sinusitis - fluid in multiple sinuses - right side is worse than the left. - lets lengthen the levaquin course and get her back to ENT since this has been so persistent. Please fax over all notes for last month, labs, and imaging to Dr. Radene Journey

## 2015-11-08 NOTE — Telephone Encounter (Signed)
Pt would like to know her CT results.  [phone].

## 2015-11-08 NOTE — Telephone Encounter (Signed)
Pt stated that she is hesitant to take another round of levaquin, pt states levaquin makes her "feel like crap" and causes her to have "a funny taste in my mouth". She is also under the impression that something else is wrong with her since this is the second antibiotic she has taken for this issue. However, she would like you to send in the levaquin stating that she "might take it". She also stated she will make an appt with Dr. Lucia Gaskins.

## 2015-11-09 MED ORDER — AMOXICILLIN-POT CLAVULANATE ER 1000-62.5 MG PO TB12: 2.0000 | ORAL_TABLET | Freq: Two times a day (BID) | ORAL | 0 refills | Status: DC

## 2015-11-09 NOTE — Telephone Encounter (Signed)
Left message for pt to call back  °

## 2015-11-09 NOTE — Telephone Encounter (Signed)
It is unlikely that something else is wrong with her since she has not had any sinus problems until now.  The most likely cause of sinusitis that has not responded to antibiotics is that is is caused by a bacterial strain that has become antibiotic resistant. Hopefully Dr. Lucia Gaskins will be able to get a sample of the fluid in a sinus to culture so we can find out exactly which antibiotic will work. Since she has failed amoxicillin and levaquin, current guidelines suggest that the antibiotic bacteria is most likely to respond to very high dose of amox/clav and that she should start to notice some improvement within 1-3d of starting this and if she is not feeling some better within 5d then she does not need to finish the anbitioic course.  Since she did not tolerate the levaquin and it didn't help, I sent in Augmentin ER 2 tabs twice a day for 10d to the [facility] in [place].

## 2015-11-15 ENCOUNTER — Other Ambulatory Visit: Payer: Self-pay | Admitting: Obstetrics

## 2015-11-15 ENCOUNTER — Ambulatory Visit
Admission: RE | Admit: 2015-11-15 | Discharge: 2015-11-15 | Disposition: A | Discharge status: Home / Self Care | Payer: BLUE CROSS/BLUE SHIELD | Source: Ambulatory Visit | Attending: Obstetrics | Admitting: Obstetrics

## 2015-11-15 DIAGNOSIS — R921 Mammographic calcification found on diagnostic imaging of breast: Secondary | ICD-10-CM

## 2015-11-16 NOTE — Telephone Encounter (Signed)
Pt seen by Dr. Radene Journey on 8/31.

## 2016-04-10 ENCOUNTER — Other Ambulatory Visit: Payer: Self-pay | Admitting: Obstetrics

## 2016-04-10 DIAGNOSIS — R921 Mammographic calcification found on diagnostic imaging of breast: Secondary | ICD-10-CM

## 2016-05-20 ENCOUNTER — Inpatient Hospital Stay
Admission: RE | Admit: 2016-05-20 | Discharge: 2016-05-20 | Disposition: A | Discharge status: Home / Self Care | Payer: BLUE CROSS/BLUE SHIELD | Source: Ambulatory Visit | Attending: Obstetrics | Admitting: Obstetrics

## 2016-05-27 ENCOUNTER — Ambulatory Visit
Admission: RE | Admit: 2016-05-27 | Discharge: 2016-05-27 | Disposition: A | Discharge status: Home / Self Care | Payer: BLUE CROSS/BLUE SHIELD | Source: Ambulatory Visit | Attending: Obstetrics | Admitting: Obstetrics

## 2016-05-27 DIAGNOSIS — R921 Mammographic calcification found on diagnostic imaging of breast: Secondary | ICD-10-CM

## 2016-10-20 ENCOUNTER — Other Ambulatory Visit: Payer: Self-pay | Admitting: Obstetrics

## 2016-10-20 DIAGNOSIS — R921 Mammographic calcification found on diagnostic imaging of breast: Secondary | ICD-10-CM

## 2016-10-27 ENCOUNTER — Ambulatory Visit (INDEPENDENT_AMBULATORY_CARE_PROVIDER_SITE_OTHER): Payer: BLUE CROSS/BLUE SHIELD | Admitting: Family Medicine

## 2016-10-27 ENCOUNTER — Encounter: Payer: Self-pay | Admitting: Family Medicine

## 2016-10-27 DIAGNOSIS — X32XXXA Exposure to sunlight, initial encounter: Secondary | ICD-10-CM

## 2016-10-27 DIAGNOSIS — Z8249 Family history of ischemic heart disease and other diseases of the circulatory system: Secondary | ICD-10-CM

## 2016-10-27 DIAGNOSIS — L570 Actinic keratosis: Secondary | ICD-10-CM | POA: Insufficient documentation

## 2016-10-27 DIAGNOSIS — Z9889 Other specified postprocedural states: Secondary | ICD-10-CM

## 2016-10-27 DIAGNOSIS — Z8774 Personal history of (corrected) congenital malformations of heart and circulatory system: Secondary | ICD-10-CM

## 2016-10-27 DIAGNOSIS — R001 Bradycardia, unspecified: Secondary | ICD-10-CM

## 2016-10-27 DIAGNOSIS — E669 Obesity, unspecified: Secondary | ICD-10-CM

## 2016-10-27 DIAGNOSIS — Z808 Family history of malignant neoplasm of other organs or systems: Secondary | ICD-10-CM

## 2016-10-27 DIAGNOSIS — R928 Other abnormal and inconclusive findings on diagnostic imaging of breast: Secondary | ICD-10-CM

## 2016-10-27 DIAGNOSIS — J329 Chronic sinusitis, unspecified: Secondary | ICD-10-CM

## 2016-10-27 NOTE — Progress Notes (Signed)
New patient office visit note:  Impression and Recommendations:    1. Obesity (BMI 30-39.9)   2. Family history of coronary artery disease in brother   3. Status post device closure of ASD- with [phone]   4. Abnormal mammogram of right breast   5. Family history of malignant melanoma of skin- sis around age 69ish   6. Chronic sinusitis, unspecified location   7. Chronic sinus bradycardia      No problem-specific Assessment & Plan notes found for this encounter.   The patient was counseled, risk factors were discussed, anticipatory guidance given.   New Prescriptions   No medications on file    No orders of the defined types were placed in this encounter.   Discontinued Medications   AMOXICILLIN-CLAVULANATE (AUGMENTIN XR) 1000-62.5 MG 12 HR TABLET    Take 2 tablets by mouth 2 (two) times daily.   CHLORPHENIRAMINE-HYDROCODONE (TUSSIONEX PENNKINETIC ER) 10-8 MG/5ML SUER    Take 5 mLs by mouth every 12 (twelve) hours as needed.   FLUTICASONE (FLONASE) 50 MCG/ACT NASAL SPRAY    Place 2 sprays into both nostrils daily.    Modified Medications   No medications on file    No orders of the defined types were placed in this encounter.    Gross side effects, risk and benefits, and alternatives of medications discussed with patient.  Patient is aware that all medications have potential side effects and we are unable to predict every side effect or drug-drug interaction that may occur.  Expresses verbal understanding and consents to current therapy plan and treatment regimen.  Return for Fasting bldwrk-near future;then OV w me 1 wk later; bring in lose it.  Please see AVS handed out to patient at the end of our visit for further patient instructions/ counseling done pertaining to today's office visit.    Note: This document was prepared using Dragon voice recognition software and may include unintentional dictation  errors.  ----------------------------------------------------------------------------------------------------------------------    Subjective:    Chief complaint:   Chief Complaint  Patient presents with  . Establish Care     HPI: Kathleen Bowen is a pleasant 46 y.o. female who presents to [place] [facility] today to review their medical history with me and establish care.   I asked the patient to review their chronic problem list with me to ensure everything was updated and accurate.    All recent office visits with other providers, any medical records that patient brought in etc  - I reviewed today.     Also asked pt to get me medical records from [facility] providers/ specialists that they had seen within the past 3-5 years- if they are in private practice and/or do not work for a [employer], [facility], [facility], [place] or [employer] owned practice.  Told them to call their specialists to clarify this if they are not sure.   [place] - prior PCP.  Patient does elliptical and walking 3 times per week at least for 30-45 minutes.  She has no symptoms.  But patient has a history of ASD repair 1992 and has had some chronic dizziness a value by Dr. Dorris Carnes in 2015 for this.  She underwent an echocardiogram and Holter monitoring.  Patient workup was negative and she was told to follow-up if she had any further problems.  Patient has been essentially asymptomatic with activity since.  She has never followed up since.     Problem  Abnormal  Mammogram of Right Breast   February 2017 had a biopsy of right breast.  Found to be calcifications and normal.  Goes for yearly diagnostic mammograms now.  At [facility]-  q 55mosurveillance.     Sk (Solar Keratosis)   Patient goes to dermatology yearly.  Used to go to [employer]  Had removal of skin lesion on back and even sent to dermatopathology for clear margin excision.    Patient has unknown history of exactly what she was  diagnosed with.   Status post device closure of ASD- with [phone]  Asx entire time   Chronic sinusitis-    ENT- 2017  CRadene Journey MD   Family History of Coronary Artery Disease in Brother   Brother had a heart attack in his mid 565s  He was an alcoholic for many years.   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  This has been for yrs now.   - Pt occ gets dizzy w postural changes/ quick changes- past couple yrs (3-4).  Mom with h/o PM at age 3777or so.     - [facility]   Family history of malignant melanoma of skin- sis around age 1854ish Obesity (Bmi 30-39.9)      Wt Readings from Last 3 Encounters:  10/27/16 193 lb 12.8 oz (87.9 kg)  11/02/15 195 lb (88.5 kg)  10/30/15 158 lb (71.7 kg)   BP Readings from Last 3 Encounters:  10/27/16 114/74  11/02/15 122/72  10/30/15 102/66   Pulse Readings from Last 3 Encounters:  10/27/16 80  11/02/15 76  10/30/15 78   BMI Readings from Last 3 Encounters:  10/27/16 30.35 kg/m  11/02/15 31.96 kg/m  10/30/15 24.02 kg/m    Patient Care Team    Relationship Specialty Notifications Start End  OMellody Dance DO PCP - General Family Medicine  10/27/16   FAloha Gell MD Consulting Physician Obstetrics and Gynecology  10/27/16   Dermatology, GLaurelPhysician   10/27/16    Comment: used to be HCrista Luria MD- now [facility] MD    Patient Active Problem List   Diagnosis Date Noted  . Abnormal mammogram of right breast 10/27/2016  . SK (solar keratosis) 10/27/2016  . Status post device closure of ASD- with [phone]/2018  . Chronic sinusitis-  10/27/2016  . Family history of coronary artery disease in brother 10/27/2016  . 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 Family history of malignant melanoma of skin- sis around age 1838ish08/20/2018  . Obesity (BMI 30-39.9) 10/27/2016     Past Medical History:  Diagnosis  Date  . Heart disease   . Heart murmur   . Hx of PTL (preterm labor), current pregnancy      Past Medical History:  Diagnosis Date  . Heart disease   . Heart murmur   . Hx of PTL (preterm labor), current pregnancy      Past Surgical History:  Procedure Laterality Date  . ASD REPAIR  1992  . BREAST BIOPSY Right 05/14/2015   Benign  . HEMORROIDECTOMY    . MOHS SURGERY     PRECANCEROUS SKIN REMOVED  . TONSILLECTOMY       Family History  Problem Relation Age of Onset  . Skin cancer Sister   . Liver cancer Paternal Grandfather   . Alcoholism Paternal Grandfather   . Heart disease Mother  PACE MAKER  . Skin cancer Mother        ON NOSE  . Atrial fibrillation Mother   . Thrombophlebitis Daughter   . ADD / ADHD Daughter   . Hypertension Father   . Diabetes Paternal Grandmother   . Heart attack Maternal Grandmother   . Heart attack Brother   . ADD / ADHD Son      History  Drug Use No     History  Alcohol Use No     History  Smoking Status  . Never Smoker  Smokeless Tobacco  . Never Used     Outpatient Encounter Prescriptions as of 10/27/2016  Medication Sig  . norethindrone-ethinyl estradiol (JUNEL FE,GILDESS FE,LOESTRIN FE) 1-20 MG-MCG tablet Take 1 tablet by mouth daily.  . [DISCONTINUED] amoxicillin-clavulanate (AUGMENTIN XR) 1000-62.5 MG 12 hr tablet Take 2 tablets by mouth 2 (two) times daily.  . [DISCONTINUED] chlorpheniramine-HYDROcodone (TUSSIONEX PENNKINETIC ER) 10-8 MG/5ML SUER Take 5 mLs by mouth every 12 (twelve) hours as needed.  . [DISCONTINUED] fluticasone (FLONASE) 50 MCG/ACT nasal spray Place 2 sprays into both nostrils daily.   No facility-administered encounter medications on file as of 10/27/2016.     Allergies: Patient has no known allergies.   ROS   Objective:   Blood pressure 114/74, pulse 80, height 5' 7"  (1.702 m), weight 193 lb 12.8 oz (87.9 kg), last menstrual period 10/07/2016. Body mass index is 30.35  kg/m. General: Well Developed, well nourished, and in no acute distress.  Neuro: Alert and oriented x3, extra-ocular muscles intact, sensation grossly intact.  HEENT:[place]/AT, PERRLA, neck supple, No carotid bruits Skin: no gross rashes  Cardiac: Regular rate and rhythm Respiratory: Essentially clear to auscultation bilaterally. Not using accessory muscles, speaking in full sentences.  Abdominal: not grossly distended Musculoskeletal: Ambulates w/o diff, FROM * 4 ext.  Vasc: less 2 sec cap RF, warm and pink  Psych:  No HI/SI, judgement and insight good, Euthymic mood. Full Affect.    No results found for this or any previous visit (from the past 2160 hour(s)).

## 2016-10-27 NOTE — Patient Instructions (Signed)
Use lose it app or my fitness pal.  I want you to track everything you put in your mouth for a good 4 weeks.  Then make a follow-up with me so we can review that.  We can discuss weight loss medications at that time and really do some goal setting.  - come in for FBW near future-  then make a follow-up office visit to discuss it 1-2 weeks later.     Behavior Modification Ideas for Weight Management  Weight management involves adopting a healthy lifestyle that includes a knowledge of nutrition and exercise, a positive attitude and the right kind of motivation. Internal motives such as better health, increased energy, self-esteem and personal control increase your chances of lifelong weight management success.  Remember to have realistic goals and think long-term success. Believe in yourself and you can do it. The following information will give you ideas to help you meet your goals.  Control Your Home Environment  Eat only while sitting down at the kitchen or dining room table. Do not eat while watching television, reading, cooking, talking on the phone, standing at the refrigerator or working on the computer. Keep tempting foods out of the house - don't buy them. Keep tempting foods out of sight. Have low-calorie foods ready to eat. Unless you are preparing a meal, stay out of the kitchen. Have healthy snacks at your disposal, such as small pieces of fruit, vegetables, canned fruit, pretzels, low-fat string cheese and nonfat cottage cheese.  Control Your Work Environment  Do not eat at [employer] or keep tempting snacks at your desk. If you get hungry between meals, plan healthy snacks and bring them with you to work. During your breaks, go for a walk instead of eating. If you work around food, plan in advance the one item you will eat at mealtime. Make it inconvenient to nibble on food by chewing gum, sugarless candy or drinking water or another low-calorie beverage. Do not work through  meals. Skipping meals slows down metabolism and may result in overeating at the next meal. If food is available for special occasions, either pick the healthiest item, nibble on low-fat snacks brought from home, don't have anything offered, choose one option and have a small amount, or have only a beverage.  Control Your Mealtime Environment  Serve your plate of food at the stove or kitchen counter. Do not put the serving dishes on the table. If you do put dishes on the table, remove them immediately when finished eating. Fill half of your plate with vegetables, a quarter with lean protein and a quarter with starch. Use smaller plates, bowls and glasses. A smaller portion will look large when it is in a little dish. Politely refuse second helpings. When fixing your plate, limit portions of food to one scoop/serving or less.   Daily Food Management  Replace eating with another activity that you will not associate with food. Wait 20 minutes before eating something you are craving. Drink a large glass of water or diet soda before eating. Always have a big glass or bottle of water to drink throughout the day. Avoid high-calorie add-ons such as cream with your coffee, butter, mayonnaise and salad dressings.  Shopping: Do not shop when hungry or tired. Shop from a list and avoid buying anything that is not on your list. If you must have tempting foods, buy individual-sized packages and try to find a lower-calorie alternative. Don't taste test in the store. Read food labels. Compare  products to help you make the healthiest choices.  Preparation: Chew a piece of gum while cooking meals. Use a quarter teaspoon if you taste test your food. Try to only fix what you are going to eat, leaving yourself no chance for seconds. If you have prepared more food than you need, portion it into individual containers and freeze or refrigerate immediately. Don't snack while cooking meals.  Eating: Eat  slowly. Remember it takes about 20 minutes for your stomach to send a message to your brain that it is full. Don't let fake hunger make you think you need more. The ideal way to eat is to take a bite, put your utensil down, take a sip of water, cut your next bite, take a bit, put your utensil down and so on. Do not cut your food all at one time. Cut only as needed. Take small bites and chew your food well. Stop eating for a minute or two at least once during a meal or snack. Take breaks to reflect and have conversation.  Cleanup and Leftovers: Label leftovers for a specific meal or snack. Freeze or refrigerate individual portions of leftovers. Do not clean up if you are still hungry.  Eating Out and Social Eating  Do not arrive hungry. Eat something light before the meal. Try to fill up on low-calorie foods, such as vegetables and fruit, and eat smaller portions of the high-calorie foods. Eat foods that you like, but choose small portions. If you want seconds, wait at least 20 minutes after you have eaten to see if you are actually hungry or if your eyes are bigger than your stomach. Limit alcoholic beverages. Try a soda water with a twist of lime. Do not skip other meals in the day to save room for the special event.  At Restaurants: Order  la carte rather than buffet style. Order some vegetables or a salad for an appetizer instead of eating bread. If you order a high-calorie dish, share it with someone. Try an after-dinner mint with your coffee. If you do have dessert, share it with two or more people. Don't overeat because you do not want to waste food. Ask for a doggie bag to take extra food home. Tell the server to put half of your entree in a to go bag before the meal is served to you. Ask for salad dressing, gravy or high-fat sauces on the side. Dip the tip of your fork in the dressing before each bite. If bread is served, ask for only one piece. Try it plain without butter or  oil. At [employer] where oil and vinegar is served with bread, use only a small amount of oil and a lot of vinegar for dipping.  At a Friend[facility] House: Offer to bring a dish, appetizer or dessert that is low in calories. Serve yourself small portions or tell the host that you only want a small amount. Stand or sit away from the snack table. Stay away from the kitchen or stay busy if you are near the food. Limit your alcohol intake.  At [employer] and Cafeterias: Cover most of your plate with lettuce and/or vegetables. Use a salad plate instead of a dinner plate. After eating, clear away your dishes before having coffee or tea.  Entertaining at Home: Explore low-fat, low-cholesterol cookbooks. Use single-serving foods like chicken breasts or hamburger patties. Prepare low-calorie appetizers and desserts.   Holidays: Keep tempting foods out of sight. Decorate the house without using food. Have low-calorie  beverages and foods on hand for guests. Allow yourself one planned treat a day. Don't skip meals to save up for the holiday feast. Eat regular, planned meals.   Exercise Well  Make exercise a priority and a planned activity in the day. If possible, walk the entire or part of the distance to work. Get an exercise buddy. Go for a walk with a colleague during one of your breaks, go to the gym, run or take a walk with a friend, walk in the mall with a shopping companion. Park at the end of the parking lot and walk to the store or office entrance. Always take the stairs all of the way or at least part of the way to your floor. If you have a desk job, walk around the office frequently. Do leg lifts while sitting at your desk. Do something outside on the weekends like going for a hike or a bike ride.   Have a Healthy Attitude  Make health your weight management priority. Be realistic. Have a goal to achieve a healthier you, not necessarily the lowest weight or ideal weight based  on calculations or tables. Focus on a healthy eating style, not on dieting. Dieting usually lasts for a short amount of time and rarely produces long-term success. Think long term. You are developing new healthy behaviors to follow next month, in a year and in a decade.    This information is for educational purposes only and is not intended to replace the advice of your doctor or health care provider. We encourage you to discuss with your doctor any questions or concerns you may have.        Guidelines for Losing Weight   We want weight loss that will last so you should lose 1-2 pounds a week.  THAT IS IT! Please pick THREE things a month to change. Once it is a habit check off the item. Then pick another three items off the list to become habits.  If you are already doing a habit on the list GREAT!  Cross that item off!  Don't drink your calories. Ie, alcohol, soda, fruit juice, and sweet tea.   Drink more water. Drink a glass when you feel hungry or before each meal.   Eat breakfast - Complex carb and protein (likeDannon light and fit yogurt, oatmeal, fruit, eggs, [place] bacon).  Measure your cereal.  Eat no more than one cup a day. (ie Kashi)  Eat an apple a day.  Add a vegetable a day.  Try a new vegetable a month.  Use Pam! Stop using oil or butter to cook.  Don't finish your plate or use smaller plates.  Share your dessert.  Eat sugar free Jello for dessert or frozen grapes.  Don't eat 2-3 hours before bed.  Switch to whole wheat bread, pasta, and brown rice.  Make healthier choices when you eat out. No fries!  Pick baked chicken, NOT fried.  Don't forget to SLOW DOWN when you eat. It is not going anywhere.   Take the stairs.  Park far away in the parking lot  Lift soup cans (or weights) for 10 minutes while watching TV.  Walk at work for 10 minutes during break.  Walk outside 1 time a week with your friend, kids, dog, or significant other.  Start a  walking group at church.  Walk the mall as much as you can tolerate.   Keep a food diary.  Weigh yourself daily.  Walk for 15  minutes 3 days per week.  Cook at home more often and eat out less. If life happens and you go back to old habits, it is okay.  Just start over. You can do it!  If you experience chest pain, get short of breath, or tired during the exercise, please stop immediately and inform your doctor.    Before you even begin to attack a weight-loss plan, it pays to remember this: You are not fat. You have fat. Losing weight isn't about blame or shame; it's simply another achievement to accomplish. Dieting is like any other skill-you have to buckle down and work at it. As long as you act in a smart, reasonable way, you'll ultimately get where you want to be. Here are some weight loss pearls for you.   1. It's Not a Diet. It's a Lifestyle Thinking of a diet as something you're on and suffering through only for the short term doesn't work. To shed weight and keep it off, you need to make permanent changes to the way you eat. It's OK to indulge occasionally, of course, but if you cut calories temporarily and then revert to your old way of eating, you'll gain back the weight quicker than you can say yo-yo. Use it to lose it. Research shows that one of the best predictors of long-term weight loss is how many pounds you drop in the first month. For that reason, nutritionists often suggest being stricter for the first two weeks of your new eating strategy to build momentum. Cut out added sugar and alcohol and avoid unrefined carbs. After that, figure out how you can reincorporate them in a way that's healthy and maintainable.  2. There's a Right Way to Exercise Working out burns calories and fat and boosts your metabolism by building muscle. But those trying to lose weight are notorious for overestimating the number of calories they burn and underestimating the amount they take in.  Unfortunately, your system is biologically programmed to hold on to extra pounds and that means when you start exercising, your body senses the deficit and ramps up its hunger signals. If you're not diligent, you'll eat everything you burn and then some. Use it, to lose it. Cardio gets all the exercise glory, but strength and interval training are the real heroes. They help you build lean muscle, which in turn increases your metabolism and calorie-burning ability 3. Don't Overreact to Mild Hunger Some people have a hard time losing weight because of hunger anxiety. To them, being hungry is bad-something to be avoided at all costs-so they carry snacks with them and eat when they don't need to. Others eat because they're stressed out or bored. While you never want to get to the point of being ravenous (that's when bingeing is likely to happen), a hunger pang, a craving, or the fact that it's 3:00 p.m. should not send you racing for the vending machine or obsessing about the energy bar in your purse. Ideally, you should put off eating until your stomach is growling and it's difficult to concentrate.  Use it to lose it. When you feel the urge to eat, use the HALT method. Ask yourself, Am I really hungry? Or am I angry or anxious, lonely or bored, or tired? If you're still not certain, try the apple test. If you're truly hungry, an apple should seem delicious; if it doesn't, something else is going on. Or you can try drinking water and making yourself busy, if you are still hungry  try a healthy snack.  4. Not All Calories Are Created Equal The mechanics of weight loss are pretty simple: Take in fewer calories than you use for energy. But the kind of food you eat makes all the difference. Processed food that's high in saturated fat and refined starch or sugar can cause inflammation that disrupts the hormone signals that tell your brain you're full. The result: You eat a lot more.  Use it to lose it. Clean up your  diet. Swap in whole, unprocessed foods, including vegetables, lean protein, and healthy fats that will fill you up and give you the biggest nutritional bang for your calorie buck. In a few weeks, as your brain starts receiving regular hunger and fullness signals once again, you'll notice that you feel less hungry overall and naturally start cutting back on the amount you eat.  5. Protein, Produce, and Plant-Based Fats Are Your Weight-Loss Trinity Here's why eating the three Ps regularly will help you drop pounds. Protein fills you up. You need it to build lean muscle, which keeps your metabolism humming so that you can torch more fat. People in a weight-loss program who ate double the recommended daily allowance for protein (about 110 grams for a 150-pound woman) lost 70 percent of their weight from fat, while people who ate the RDA lost only about 40 percent, one study found. Produce is packed with filling fiber. "It's very difficult to consume too many calories if you're eating a lot of vegetables. Example: Three cups of broccoli is a lot of food, yet only 93 calories. (Fruit is another story. It can be easy to overeat and can contain a lot of calories from sugar, so be sure to monitor your intake.) Plant-based fats like olive oil and those in avocados and nuts are healthy and extra satiating.  Use it to lose it. Aim to incorporate each of the three Ps into every meal and snack. People who eat protein throughout the day are able to keep weight off, according to a study in the [place] of Clinical Nutrition. In addition to meat, poultry and seafood, good sources are beans, lentils, eggs, tofu, and yogurt. As for fat, keep portion sizes in check by measuring out salad dressing, oil, and nut butters (shoot for one to two tablespoons). Finally, eat veggies or a little fruit at every meal. People who did that consumed 308 fewer calories but didn't feel any hungrier than when they didn't eat more produce.   7. How You Eat Is As Important As What You Eat In order for your brain to register that you're full, you need to focus on what you're eating. Sit down whenever you eat, preferably at a table. Turn off the TV or computer, put down your phone, and look at your food. Smell it. Chew slowly, and don't put another bite on your fork until you swallow. When women ate lunch this attentively, they consumed 30 percent less when snacking later than those who listened to an audiobook at lunchtime, according to a study in the [place] of Nutrition. 8. Weighing Yourself Really Works The scale provides the best evidence about whether your efforts are paying off. Seeing the numbers tick up or down or stagnate is motivation to keep going-or to rethink your approach. A 2015 study at [facility] found that daily weigh-ins helped people lose more weight, keep it off, and maintain that loss, even after two years. Use it to lose it. Step on the scale at the  same time every day for the best results. If your weight shoots up several pounds from one weigh-in to the next, don't freak out. Eating a lot of salt the night before or having your period is the likely culprit. The number should return to normal in a day or two. It's a steady climb that you need to do something about. 9. Too Much Stress and Too Little Sleep Are Your Enemies When you're tired and frazzled, your body cranks up the production of cortisol, the stress hormone that can cause carb cravings. Not getting enough sleep also boosts your levels of ghrelin, a hormone associated with hunger, while suppressing leptin, a hormone that signals fullness and satiety. People on a diet who slept only five and a half hours a night for two weeks lost 55 percent less fat and were hungrier than those who slept eight and a half hours, according to a study in the [place]. Use it to lose it. Prioritize sleep, aiming for seven hours or more a  night, which research shows helps lower stress. And make sure you're getting quality zzz's. If a snoring spouse or a fidgety cat wakes you up frequently throughout the night, you may end up getting the equivalent of just four hours of sleep, according to a study from [facility]. Keep pets out of the bedroom, and use a white-noise app to drown out snoring. 10. You Will Hit a plateau-And You Can Bust Through It As you slim down, your body releases much less leptin, the fullness hormone.  If you're not strength training, start right now. Building muscle can raise your metabolism to help you overcome a plateau. To keep your body challenged and burning calories, incorporate new moves and more intense intervals into your workouts or add another sweat session to your weekly routine. Alternatively, cut an extra 100 calories or so a day from your diet. Now that you've lost weight, your body simply doesn't need as much fuel.    Since food equals calories, in order to lose weight you must either eat fewer calories, exercise more to burn off calories with activity, or both. Food that is not used to fuel the body is stored as fat. A major component of losing weight is to make smarter food choices. Here's how:  1)   Limit non-nutritious foods, such as: Sugar, honey, syrups and candy Pastries, donuts, pies, cakes and cookies Soft drinks, sweetened juices and alcoholic beverages  2)  Cut down on high-fat foods by: - Choosing poultry, fish or lean red meat - Choosing low-fat cooking methods, such as baking, broiling, steaming, grilling and boiling - Using low-fat or non-fat dairy products - Using vinaigrette, herbs, lemon or fat-free salad dressings - Avoiding fatty meats, such as bacon, sausage, franks, ribs and luncheon meats - Avoiding high-fat snacks like nuts, chips and chocolate - Avoiding fried foods - Using less butter, margarine, oil and mayonnaise - Avoiding high-fat gravies, cream sauces  and cream-based soups  3) Eat a variety of foods, including: - Fruit and vegetables that are raw, steamed or baked - Whole grains, breads, cereal, rice and pasta - Dairy products, such as low-fat or non-fat milk or yogurt, low-fat cottage cheese and low-fat cheese - Protein-rich foods like chicken, [place], fish, lean meat and legumes, or beans  4) Change your eating habits by: - Eat three balanced meals a day to help control your hunger - Watch portion sizes and eat small servings of a variety of  foods - Choose low-calorie snacks - Eat only when you are hungry and stop when you are satisfied - Eat slowly and try not to perform other tasks while eating - Find other activities to distract you from food, such as walking, taking up a hobby or being involved in the community - Include regular exercise in your daily routine ( minimum of 20 min of moderate-intensity exercise at least 5 days/week)  - Find a support group, if necessary, for emotional support in your weight loss journey           Easy ways to cut 100 calories   1. Eat your eggs with hot sauce OR salsa instead of cheese.  Eggs are great for breakfast, but many people consider eggs and cheese to be BFFs. Instead of cheese-1 oz. of cheddar has 114 calories-top your eggs with hot sauce, which contains no calories and helps with satiety and metabolism. Salsa is also a great option!!  2. Top your toast, waffles or pancakes with fresh berries instead of jelly or syrup. Half a cup of berries-fresh, frozen or thawed-has about 40 calories, compared with 2 tbsp. of maple syrup or jelly, which both have about 100 calories. The berries will also give you a good punch of fiber, which helps keep you full and satisfied and won't spike blood sugar quickly like the jelly or syrup. 3. Swap the non-fat latte for black coffee with a splash of half-and-half. Contrary to its name, that non-fat latte has 130 calories and a startling 19g of  carbohydrates per 16 oz. serving. Replacing that 'light' drinkable dessert with a black coffee with a splash of half-and-half saves you more than 100 calories per 16 oz. serving. 4. Sprinkle salads with freeze-dried raspberries instead of dried cranberries. If you want a sweet addition to your nutritious salad, stay away from dried cranberries. They have a whopping 130 calories per  cup and 30g carbohydrates. Instead, sprinkle freeze-dried raspberries guilt-free and save more than 100 calories per  cup serving, adding 3g of belly-filling fiber. 5. Go for mustard in place of mayo on your sandwich. Mustard can add really nice flavor to any sandwich, and there are tons of varieties, from spicy to honey. A serving of mayo is 95 calories, versus 10 calories in a serving of mustard.  Or try an avocado mayo spread: You can find the recipe few click this link: https://www.californiaavocado.com/recipes/recipe-container/[place]-avocado-mayo 6. Choose a DIY salad dressing instead of the store-bought kind. Mix Dijon or whole grain mustard with low-fat Kefir or red wine vinegar and garlic. 7. Use hummus as a spread instead of a dip. Use hummus as a spread on a high-fiber cracker or tortilla with a sandwich and save on calories without sacrificing taste. 8. Pick just one salad "accessory." Salad isn't automatically a calorie winner. It's easy to over-accessorize with toppings. Instead of topping your salad with nuts, avocado and cranberries (all three will clock in at 313 calories), just pick one. The next day, choose a different accessory, which will also keep your salad interesting. You don't wear all your jewelry every day, right? 9. Ditch the white pasta in favor of spaghetti squash. One cup of cooked spaghetti squash has about 40 calories, compared with traditional spaghetti, which comes with more than 200. Spaghetti squash is also nutrient-dense. It's a good source of fiber and Vitamins A and C, and it  can be eaten just like you would eat pasta-with a great tomato sauce and [place] meatballs or with pesto, tofu  and spinach, for example. 10. Dress up your chili, soups and stews with non-fat [place] yogurt instead of sour cream. Just a 'dollop' of sour cream can set you back 115 calories and a whopping 12g of fat-seven of which are of the artery-clogging variety. Added bonus: [place] yogurt is packed with muscle-building protein, calcium and B Vitamins. 11. Mash cauliflower instead of mashed potatoes. One cup of traditional mashed potatoes-in all their creamy goodness-has more than 200 calories, compared to mashed cauliflower, which you can typically eat for less than 100 calories per 1 cup serving. Cauliflower is a great source of the antioxidant indole-3-carbinol (I3C), which may help reduce the risk of some cancers, like breast cancer. 12. Ditch the ice cream sundae in favor of a [place] yogurt parfait. Instead of a cup of ice cream or fro-yo for dessert, try 1 cup of nonfat Greek yogurt topped with fresh berries and a sprinkle of cacao nibs. Both toppings are packed with antioxidants, which can help reduce cellular inflammation and oxidative damage. And the comparison is a no-brainer: One cup of ice cream has about 275 calories; one cup of frozen yogurt has about 230; and a cup of Greek yogurt has just 130, plus twice the protein, so you're less likely to return to the freezer for a second helping. 13. Put olive oil in a spray container instead of using it directly from the bottle. Each tablespoon of olive oil is 120 calories and 15g of fat. Use a mister instead of pouring it straight into the pan or onto a salad. This allows for portion control and will save you more than 100 calories. 14. When baking, substitute canned pumpkin for butter or oil. Canned pumpkin-not pumpkin pie mix-is loaded with Vitamin A, which is important for skin and eye health, as well as immunity. And the comparisons are pretty crazy:   cup of canned pumpkin has about 40 calories, compared to butter or oil, which has more than 800 calories. Yes, 800 calories. Applesauce and mashed banana can also serve as good substitutions for butter or oil, usually in a 1:1 ratio. 15. Top casseroles with high-fiber cereal instead of breadcrumbs. Breadcrumbs are typically made with white bread, while breakfast cereals contain 5-9g of fiber per serving. Not only will you save more than 150 calories per  cup serving, the swap will also keep you more full and you'll get a metabolism boost from the added fiber. 16. Snack on pistachios instead of macadamia nuts. Believe it or not, you get the same amount of calories from 35 pistachios (100 calories) as you would from only five macadamia nuts. 17. Chow down on kale chips rather than potato chips. This is my favorite 'don't knock it 'till you try it' swap. Kale chips are so easy to make at home, and you can spice them up with a little grated parmesan or chili powder. Plus, they're a mere fraction of the calories of potato chips, but with the same crunch factor we crave so often. 18. Add seltzer and some fruit slices to your cocktail instead of soda or fruit juice. One cup of soda or fruit juice can pack on as much as 140 calories. Instead, use seltzer and fruit slices. The fruit provides valuable phytochemicals, such as flavonoids and anthocyanins, which help to combat cancer and stave off the aging process.

## 2016-11-18 ENCOUNTER — Other Ambulatory Visit: Payer: BLUE CROSS/BLUE SHIELD

## 2016-11-18 DIAGNOSIS — Z0000 Encounter for general adult medical examination without abnormal findings: Secondary | ICD-10-CM

## 2016-11-18 DIAGNOSIS — Z8249 Family history of ischemic heart disease and other diseases of the circulatory system: Secondary | ICD-10-CM

## 2016-11-18 DIAGNOSIS — E669 Obesity, unspecified: Secondary | ICD-10-CM

## 2016-11-19 LAB — CBC WITH DIFFERENTIAL/PLATELET
BASOS ABS: 0 10*3/uL (ref 0.0–0.2)
Basos: 0 %
EOS (ABSOLUTE): 0.3 10*3/uL (ref 0.0–0.4)
EOS: 2 %
Hematocrit: 43.8 % (ref 34.0–46.6)
Hemoglobin: 15.1 g/dL (ref 11.1–15.9)
Immature Grans (Abs): 0 10*3/uL (ref 0.0–0.1)
Immature Granulocytes: 0 %
Lymphocytes Absolute: 1.9 10*3/uL (ref 0.7–3.1)
Lymphs: 17 %
MCH: 31.8 pg (ref 26.6–33.0)
MCHC: 34.5 g/dL (ref 31.5–35.7)
MCV: 92 fL (ref 79–97)
MONOS ABS: 0.8 10*3/uL (ref 0.1–0.9)
Monocytes: 7 %
NEUTROS PCT: 74 %
Neutrophils Absolute: 8.5 10*3/uL — ABNORMAL HIGH (ref 1.4–7.0)
PLATELETS: 187 10*3/uL (ref 150–379)
RBC: 4.75 x10E6/uL (ref 3.77–5.28)
RDW: 13.1 % (ref 12.3–15.4)
WBC: 11.5 10*3/uL — AB (ref 3.4–10.8)

## 2016-11-19 LAB — COMPREHENSIVE METABOLIC PANEL
A/G RATIO: 1.6 (ref 1.2–2.2)
ALT: 11 IU/L (ref 0–32)
AST: 18 IU/L (ref 0–40)
Albumin: 4.1 g/dL (ref 3.5–5.5)
Alkaline Phosphatase: 45 IU/L (ref 39–117)
BILIRUBIN TOTAL: 0.5 mg/dL (ref 0.0–1.2)
BUN/Creatinine Ratio: 15 (ref 9–23)
BUN: 13 mg/dL (ref 6–24)
CALCIUM: 9.1 mg/dL (ref 8.7–10.2)
CHLORIDE: 105 mmol/L (ref 96–106)
CO2: 20 mmol/L (ref 20–29)
Creatinine, Ser: 0.89 mg/dL (ref 0.57–1.00)
GFR calc Af Amer: 91 mL/min/{1.73_m2} (ref >59)
GFR, EST NON AFRICAN AMERICAN: 79 mL/min/{1.73_m2} (ref >59)
GLOBULIN, TOTAL: 2.5 g/dL (ref 1.5–4.5)
GLUCOSE: 88 mg/dL (ref 65–99)
POTASSIUM: 4.2 mmol/L (ref 3.5–5.2)
SODIUM: 143 mmol/L (ref 134–144)
Total Protein: 6.6 g/dL (ref 6.0–8.5)

## 2016-11-19 LAB — LIPID PANEL
Chol/HDL Ratio: 3.8 ratio (ref 0.0–4.4)
Cholesterol, Total: 165 mg/dL (ref 100–199)
HDL: 43 mg/dL
LDL Calculated: 94 mg/dL (ref 0–99)
Triglycerides: 140 mg/dL (ref 0–149)
VLDL Cholesterol Cal: 28 mg/dL (ref 5–40)

## 2016-11-19 LAB — HEMOGLOBIN A1C
ESTIMATED AVERAGE GLUCOSE: 97 mg/dL
Hgb A1c MFr Bld: 5 % (ref 4.8–5.6)

## 2016-11-19 LAB — VITAMIN D 25 HYDROXY (VIT D DEFICIENCY, FRACTURES): Vit D, 25-Hydroxy: 37.9 ng/mL (ref 30.0–100.0)

## 2016-11-19 LAB — TSH: TSH: 2 u[IU]/mL (ref 0.450–4.500)

## 2016-11-26 ENCOUNTER — Ambulatory Visit
Admission: RE | Admit: 2016-11-26 | Discharge: 2016-11-26 | Disposition: A | Discharge status: Home / Self Care | Payer: BLUE CROSS/BLUE SHIELD | Source: Ambulatory Visit | Attending: Obstetrics | Admitting: Obstetrics

## 2016-11-26 DIAGNOSIS — R921 Mammographic calcification found on diagnostic imaging of breast: Secondary | ICD-10-CM

## 2016-12-02 ENCOUNTER — Ambulatory Visit: Payer: BLUE CROSS/BLUE SHIELD | Admitting: Family Medicine

## 2016-12-22 ENCOUNTER — Ambulatory Visit: Payer: BLUE CROSS/BLUE SHIELD | Admitting: Family Medicine

## 2017-07-30 ENCOUNTER — Encounter: Payer: Self-pay | Admitting: Family Medicine

## 2017-08-19 ENCOUNTER — Ambulatory Visit: Payer: BLUE CROSS/BLUE SHIELD | Admitting: Family Medicine

## 2017-08-19 ENCOUNTER — Encounter: Payer: Self-pay | Admitting: Family Medicine

## 2017-08-19 ENCOUNTER — Other Ambulatory Visit: Payer: Self-pay

## 2017-08-19 VITALS — BP 108/68 | HR 95 | Temp 99.2°F | Resp 16 | Ht 68.5 in | Wt 191.0 lb

## 2017-08-19 DIAGNOSIS — J101 Influenza due to other identified influenza virus with other respiratory manifestations: Secondary | ICD-10-CM

## 2017-08-19 DIAGNOSIS — J029 Acute pharyngitis, unspecified: Secondary | ICD-10-CM | POA: Diagnosis not present

## 2017-08-19 LAB — POC INFLUENZA A&B (BINAX/QUICKVUE)
INFLUENZA A, POC: POSITIVE — AB
Influenza B, POC: NEGATIVE

## 2017-08-19 LAB — POCT RAPID STREP A (OFFICE): Rapid Strep A Screen: NEGATIVE

## 2017-08-19 MED ORDER — PREDNISONE 20 MG PO TABS: ORAL_TABLET | ORAL | 0 refills | Status: DC

## 2017-08-19 MED ORDER — OSELTAMIVIR PHOSPHATE 75 MG PO CAPS: 75.0000 mg | ORAL_CAPSULE | Freq: Two times a day (BID) | ORAL | 0 refills | Status: DC

## 2017-08-19 NOTE — Patient Instructions (Addendum)
IF you received an x-ray today, you will receive an invoice from [facility]. Please contact [facility] at [phone] with questions or concerns regarding your invoice.   IF you received labwork today, you will receive an invoice from [place]. Please contact [facility] at [phone] with questions or concerns regarding your invoice.   Our billing staff will not be able to assist you with questions regarding bills from these companies.  You will be contacted with the lab results as soon as they are available. The fastest way to get your results is to activate your My Chart account. Instructions are located on the last page of this paperwork. If you have not heard from [place] regarding the results in 2 weeks, please contact this office.      Influenza, Adult Influenza, more commonly known as "the flu," is a viral infection that primarily affects the respiratory tract. The respiratory tract includes organs that help you breathe, such as the lungs, nose, and throat. The flu causes many common cold symptoms, as well as a high fever and body aches. The flu spreads easily from person to person (is contagious). Getting a flu shot (influenza vaccination) every year is the best way to prevent influenza. What are the causes? Influenza is caused by a virus. You can catch the virus by:  Breathing in droplets from an infected person's cough or sneeze.  Touching something that was recently contaminated with the virus and then touching your mouth, nose, or eyes.  What increases the risk? The following factors may make you more likely to get the flu:  Not cleaning your hands frequently with soap and water or alcohol-based hand sanitizer.  Having close contact with many people during cold and flu season.  Touching your mouth, eyes, or nose without washing or sanitizing your hands first.  Not drinking enough fluids or not eating a healthy diet.  Not getting enough sleep or  exercise.  Being under a high amount of stress.  Not getting a yearly (annual) flu shot.  You may be at a higher risk of complications from the flu, such as a severe lung infection (pneumonia), if you:  Are over the age of 43.  Are pregnant.  Have a weakened disease-fighting system (immune system). You may have a weakened immune system if you: ? Have HIV or AIDS. ? Are undergoing chemotherapy. ? Aretaking medicines that reduce the activity of (suppress) the immune system.  Have a long-term (chronic) illness, such as heart disease, kidney disease, diabetes, or lung disease.  Have a liver disorder.  Are obese.  Have anemia.  What are the signs or symptoms? Symptoms of this condition typically last 4-10 days and may include:  Fever.  Chills.  Headache, body aches, or muscle aches.  Sore throat.  Cough.  Runny or congested nose.  Chest discomfort and cough.  Poor appetite.  Weakness or tiredness (fatigue).  Dizziness.  Nausea or vomiting.  How is this diagnosed? This condition may be diagnosed based on your medical history and a physical exam. Your health care provider may do a nose or throat swab test to confirm the diagnosis. How is this treated? If influenza is detected early, you can be treated with antiviral medicine that can reduce the length of your illness and the severity of your symptoms. This medicine may be given by mouth (orally) or through an IV tube that is inserted in one of your veins. The goal of treatment is to relieve symptoms by taking  care of yourself at home. This may include taking over-the-counter medicines, drinking plenty of fluids, and adding humidity to the air in your home. In some cases, influenza goes away on its own. Severe influenza or complications from influenza may be treated in a hospital. Follow these instructions at home:  Take over-the-counter and prescription medicines only as told by your health care provider.  Use a  cool mist humidifier to add humidity to the air in your home. This can make breathing easier.  Rest as needed.  Drink enough fluid to keep your urine clear or pale yellow.  Cover your mouth and nose when you cough or sneeze.  Wash your hands with soap and water often, especially after you cough or sneeze. If soap and water are not available, use hand sanitizer.  Stay home from work or school as told by your health care provider. Unless you are visiting your health care provider, try to avoid leaving home until your fever has been gone for 24 hours without the use of medicine.  Keep all follow-up visits as told by your health care provider. This is important. How is this prevented?  Getting an annual flu shot is the best way to avoid getting the flu. You may get the flu shot in late summer, fall, or winter. Ask your health care provider when you should get your flu shot.  Wash your hands often or use hand sanitizer often.  Avoid contact with people who are sick during cold and flu season.  Eat a healthy diet, drink plenty of fluids, get enough sleep, and exercise regularly. Contact a health care provider if:  You develop new symptoms.  You have: ? Chest pain. ? Diarrhea. ? A fever.  Your cough gets worse.  You produce more mucus.  You feel nauseous or you vomit. Get help right away if:  You develop shortness of breath or difficulty breathing.  Your skin or nails turn a bluish color.  You have severe pain or stiffness in your neck.  You develop a sudden headache or sudden pain in your face or ear.  You cannot stop vomiting. This information is not intended to replace advice given to you by your health care provider. Make sure you discuss any questions you have with your health care provider. Document Released: 02/22/2000 Document Revised: 08/02/2015 Document Reviewed: 12/19/2014 Elsevier Interactive Patient Education  2017 [employer].

## 2017-08-19 NOTE — Progress Notes (Signed)
Subjective:    Patient ID: Kathleen Bowen, female    DOB: [birth year], 47 y.o.   MRN: [id number]  08/19/2017  Sinus Problem (pt states she has been having pressure,congestion, and nasal drainage x 2 days ) and Headache (with face pressure )    HPI This 47 y.o. female presents for two day history of sinus congestion.   +Congestion, sore throat, pressure in ears, pressure in nose.   S/p CT sinus full in 2017 due to persistent sinus congestion. No fever/chills but +sweats. +HA +body aches. Had to get in bathtub last night.  ST; treated with Tylenol; took Tylenol 2033m within six hours.  Last Tylenol this morning. Mucinex with Tylenol this morinng.  Sudafed. +rhinorrhea; +nasal congestion; green. Less body aches. No cough.  No SOB. No n/v/d.   No allergic rhinitis. Works at school.   Husband and son with same illness. Daughter now calling complaining of chills and sweats.    BP Readings from Last 3 Encounters:  08/19/17 108/68  10/27/16 114/74  11/02/15 122/72   Wt Readings from Last 3 Encounters:  08/19/17 191 lb (86.6 kg)  10/27/16 193 lb 12.8 oz (87.9 kg)  11/02/15 195 lb (88.5 kg)   Immunization History  Administered Date(s) Administered  . Influenza,inj,Quad PF,6+ Mos 01/24/2017  . Tdap 04/04/2012    Review of Systems  Constitutional: Positive for chills, diaphoresis, fatigue and fever.  HENT: Positive for congestion, postnasal drip, rhinorrhea and sore throat. Negative for ear pain, sinus pressure and trouble swallowing.   Respiratory: Negative for cough and shortness of breath.   Cardiovascular: Negative for chest pain, palpitations and leg swelling.  Gastrointestinal: Negative for abdominal pain, constipation, diarrhea, nausea and vomiting.  Skin: Positive for rash.  Neurological: Positive for headaches.    Past Medical History:  Diagnosis Date  . Heart disease   . Heart murmur   . Hx of PTL (preterm labor), current pregnancy    Past Surgical  History:  Procedure Laterality Date  . ASD REPAIR  1992  . BREAST BIOPSY Right 05/14/2015   Benign  . HEMORROIDECTOMY    . MOHS SURGERY     PRECANCEROUS SKIN REMOVED  . TONSILLECTOMY     No Known Allergies Current Outpatient Medications on File Prior to Visit  Medication Sig Dispense Refill  . norethindrone-ethinyl estradiol (JUNEL FE,GILDESS FE,LOESTRIN FE) 1-20 MG-MCG tablet Take 1 tablet by mouth daily.     No current facility-administered medications on file prior to visit.    Social History   Socioeconomic History  . Marital status: Married    Spouse name: Not on file  . Number of children: Not on file  . Years of education: Not on file  . Highest education level: Not on file  Occupational History  . Not on file  Social Needs  . Financial resource strain: Not on file  . Food insecurity:    Worry: Not on file    Inability: Not on file  . Transportation needs:    Medical: Not on file    Non-medical: Not on file  Tobacco Use  . Smoking status: Never Smoker  . Smokeless tobacco: Never Used  Substance and Sexual Activity  . Alcohol use: No  . Drug use: No  . Sexual activity: Yes    Birth control/protection: Pill    Comment: LO ESTRIN 24    Lifestyle  . Physical activity:    Days per week: Not on file    Minutes per session: Not on  file  . Stress: Not on file  Relationships  . Social connections:    Talks on phone: Not on file    Gets together: Not on file    Attends religious service: Not on file    Active member of club or organization: Not on file    Attends meetings of clubs or organizations: Not on file    Relationship status: Not on file  . Intimate partner violence:    Fear of current or ex partner: Not on file    Emotionally abused: Not on file    Physically abused: Not on file    Forced sexual activity: Not on file  Other Topics Concern  . Not on file  Social History Narrative  . Not on file   Family History  Problem Relation Age of Onset    . Skin cancer Sister   . Liver cancer Paternal Grandfather   . Alcoholism Paternal Grandfather   . Heart disease Mother        PACE MAKER  . Skin cancer Mother        ON NOSE  . Atrial fibrillation Mother   . Thrombophlebitis Daughter   . ADD / ADHD Daughter   . Hypertension Father   . Diabetes Paternal Grandmother   . Heart attack Maternal Grandmother   . Heart attack Brother   . ADD / ADHD Son        Objective:    BP 108/68   Pulse 95   Temp 99.2 F (37.3 C) (Oral)   Resp 16   Ht 5' 8.5" (1.74 m)   Wt 191 lb (86.6 kg)   SpO2 98%   BMI 28.62 kg/m  Physical Exam  Constitutional: She is oriented to person, place, and time. She appears well-developed and well-nourished. She appears ill. No distress.  HENT:  Head: Normocephalic and atraumatic.  Right Ear: Tympanic membrane, external ear and ear canal normal.  Left Ear: External ear and ear canal normal.  Nose: Rhinorrhea present. Right sinus exhibits no maxillary sinus tenderness and no frontal sinus tenderness. Left sinus exhibits no maxillary sinus tenderness and no frontal sinus tenderness.  Eyes: Pupils are equal, round, and reactive to light. Conjunctivae are normal.  Neck: Normal range of motion. Neck supple.  Cardiovascular: Normal rate, regular rhythm and normal heart sounds. Exam reveals no gallop and no friction rub.  No murmur heard. Pulmonary/Chest: Effort normal and breath sounds normal. She has no wheezes. She has no rales.  Neurological: She is alert and oriented to person, place, and time.  Skin: She is not diaphoretic.  Psychiatric: She has a normal mood and affect. Her behavior is normal.  Nursing note and vitals reviewed.  No results found. Depression screen [facility] 2/9 08/19/2017 10/27/2016 11/02/2015 10/30/2015  Decreased Interest 0 0 0 0  Down, Depressed, Hopeless 0 0 0 0  PHQ - 2 Score 0 0 0 0   Fall Risk  08/19/2017 10/27/2016 11/02/2015 10/30/2015  Falls in the past year? No No No No   Results for  orders placed or performed in visit on 08/19/17  POC Influenza A&B(BINAX/QUICKVUE)  Result Value Ref Range   Influenza A, POC Positive (A) Negative   Influenza B, POC Negative Negative  POCT rapid strep A  Result Value Ref Range   Rapid Strep A Screen Negative Negative        Assessment & Plan:   1. Influenza A   2. Sore throat     New onset; rx  Tamiflu; supportive care with Tylenol, Mucinex, Sudafed, rest, fluids.  RTC for acute worsening.    Orders Placed This Encounter  Procedures  . Culture, Group A Strep    Order Specific Question:   Source    Answer:   oropharynx  . POC Influenza A&B(BINAX/QUICKVUE)  . POCT rapid strep A   Meds ordered this encounter  Medications  . predniSONE (DELTASONE) 20 MG tablet    Sig: Take 3 PO QAM x 3 day, 2 PO QAM x 3 days, 1 PO QAM x 3 days    Dispense:  18 tablet    Refill:  0  . oseltamivir (TAMIFLU) 75 MG capsule    Sig: Take 1 capsule (75 mg total) by mouth 2 (two) times daily.    Dispense:  10 capsule    Refill:  0    No follow-ups on file.   Ana Liaw Elayne Guerin, M.D. Primary Care at [facility] [address] [place], [place]  [address] [phone] phone [phone] fax

## 2017-08-21 LAB — CULTURE, GROUP A STREP: Strep A Culture: NEGATIVE

## 2017-10-13 ENCOUNTER — Ambulatory Visit: Payer: BLUE CROSS/BLUE SHIELD | Admitting: Family Medicine

## 2017-10-13 ENCOUNTER — Encounter: Payer: Self-pay | Admitting: Family Medicine

## 2017-10-13 VITALS — BP 122/73 | HR 63 | Ht 68.0 in | Wt 192.7 lb

## 2017-10-13 DIAGNOSIS — M25552 Pain in left hip: Secondary | ICD-10-CM | POA: Diagnosis not present

## 2017-10-13 DIAGNOSIS — M79606 Pain in leg, unspecified: Secondary | ICD-10-CM

## 2017-10-13 DIAGNOSIS — E663 Overweight: Secondary | ICD-10-CM

## 2017-10-13 MED ORDER — PREDNISONE 20 MG PO TABS: ORAL_TABLET | ORAL | 0 refills | Status: DC

## 2017-10-13 NOTE — Progress Notes (Signed)
Pt here for an acute care OV today   Impression and Recommendations:    1. Pain of Left lower extremity, unspecified laterality/ radiclupathy   2. Left hip pain   3. Overweight (BMI 25.0-29.9)    1. Acute Hip & Left Leg Pain - Prednisone taper prescribed today.  - Reviewed need for physical therapy for long-term treatment of patient's concerns. - Referral placed to physical therapy today.  - Otherwise, encouraged patient to continue engaging in daily physical activity.  Recommended that the patient eventually strive for at least 150 minutes of moderate cardiovascular activity per week according to guidelines established by the [facility].   - Healthy dietary habits encouraged, including low-carb, and high amounts of lean protein in diet.   - Begin using the LoseIt or MyFitnessPal app to track nutritional intake.  - Patient should also consume adequate amounts of water - half of body weight in oz of water per day.  2. Follow-Up - Advised patient to call front desk and ask about Physical Therapy referral if she does not receive notification soon.   - Encouraged patient to return for regularly scheduled chronic follow-up and CPE.  - Return in 1-2 months to review progress tracking with LoseIt app.  Meds ordered this encounter  Medications  . predniSONE (DELTASONE) 20 MG tablet    Sig: Take 3 pills a day for 2 days, 2 pills a day for 2 days, 1 pill a day for 2 days then one half pill a day for 2 days then off    Dispense:  14 tablet    Refill:  0    Medications Discontinued During This Encounter  Medication Reason  . oseltamivir (TAMIFLU) 75 MG capsule Completed Course  . predniSONE (DELTASONE) 20 MG tablet Completed Course     Orders Placed This Encounter  Procedures  . Ambulatory referral to Physical Therapy     Education and routine counseling performed. Handouts provided  Gross side effects, risk and benefits, and alternatives of medications and treatment plan in  general discussed with patient.  Patient is aware that all medications have potential side effects and we are unable to predict every side effect or drug-drug interaction that may occur.   Patient will call with any questions prior to using medication if they have concerns.  Expresses verbal understanding and consents to current therapy and treatment regimen.  No barriers to understanding were identified.  Red flag symptoms and signs discussed in detail.  Patient expressed understanding regarding what to do in case of emergency\urgent symptoms   Please see AVS handed out to patient at the end of our visit for further patient instructions/ counseling done pertaining to today's office visit.   Return for 1-89mo wt loss.     Note:  This document was prepared occasionally using Dragon voice recognition software and may include unintentional dictation errors in addition to a scribe.  This document serves as a record of services personally performed by Kathleen Dance DO. It was created on her behalf by KToni Amend a trained medical scribe. The creation of this record is based on the scribe's personal observations and the provider's statements to them.   I have reviewed the above medical documentation for accuracy and completeness and I concur.  Kathleen Dance09/22/19 4:53 PM   -------------------------------------------------------------------------------------------------------    Subjective:    CC:  Chief Complaint  Patient presents with  . Hip Pain    radiated down leg with numbness and tingling  HPI: Kathleen Bowen is a 47 y.o. female who presents to [facility] today for issues as discussed below.  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  Acute Hip & Left Leg Pain Patient here with acute concern of leg pain.  Notes onset about 2 months ago.  States "my hips feel like they're 45 years old."  Thought it  was perhaps her mattress; got a new mattress and it didn't help.  Remarks that her hips hurt when she sleeps at night; "I toss and turn all night."  She's been walking more often, but no other new activities.  Notes after about 20 minutes of walking, the bottom of her left foot goes numb.  This numbness travels down the left leg, with acute onset over the past 2-3 weeks.  Notes only thing she "notices" is the numbness at the bottom of her foot.  Also notes that when she squats or gets up out of the bathtub, she has pain in her left hip or, per patient, states "in my bursa."  History of similar issues: Notes she had "something pinched" in left leg years ago and utilized a prednisone taper to treat this.  Denies back pain.  Denies loss of function.  Denies pain while lying on side of her hip.  Patient has never had physical therapy for this issue.  She has never had an MRI of her back.  Patient notes that she doesn't stretch and that she doesn't drink much water.   No problems updated.   Wt Readings from Last 3 Encounters:  10/13/17 192 lb 11.2 oz (87.4 kg)  08/19/17 191 lb (86.6 kg)  10/27/16 193 lb 12.8 oz (87.9 kg)   BP Readings from Last 3 Encounters:  10/13/17 122/73  08/19/17 108/68  10/27/16 114/74   BMI Readings from Last 3 Encounters:  10/13/17 29.30 kg/m  08/19/17 28.62 kg/m  10/27/16 30.35 kg/m     Patient Care Team    Relationship Specialty Notifications Start End  Mellody Dance, DO PCP - General Family Medicine  10/27/16   Aloha Gell, MD Consulting Physician Obstetrics and Gynecology  10/27/16   Dermatology, [facility]   10/27/16    Comment: used to be Crista Luria, MD- now [facility], MD     Patient Active Problem List   Diagnosis Date Noted  . Abnormal mammogram of right breast 10/27/2016  . SK (solar keratosis) 10/27/2016  . Status post device closure of ASD- with [phone] 10/27/2016  . Chronic sinusitis-  10/27/2016  .  Family history of coronary artery disease in brother 10/27/2016  . 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 Family history of malignant melanoma of skin- sis around age 47ish 10/27/2016  . Obesity (BMI 30-39.9) 10/27/2016    Past Medical history, Surgical history, Family history, Social history, Allergies and Medications have been entered into the medical record, reviewed and changed as needed.    Current Meds  Medication Sig  . norethindrone-ethinyl estradiol (JUNEL FE,GILDESS FE,LOESTRIN FE) 1-20 MG-MCG tablet Take 1 tablet by mouth daily.    Allergies:  No Known Allergies   Review of Systems: General:   Denies fever, chills, unexplained weight loss.  Optho/Auditory:   Denies visual changes, blurred vision/LOV Respiratory:   Denies wheeze, DOE more than baseline levels.   Cardiovascular:   Denies chest pain, palpitations, new onset peripheral edema  Gastrointestinal:   Denies nausea, vomiting, diarrhea, abd pain.  Genitourinary:  Denies dysuria, freq/ urgency, flank pain or discharge from genitals.  Endocrine:     Denies hot or cold intolerance, polyuria, polydipsia. Musculoskeletal:   Denies unexplained myalgias, joint swelling, unexplained arthralgias, gait problems.  Skin:  Denies new onset rash, suspicious lesions Neurological:     Denies dizziness, unexplained weakness, numbness  Psychiatric/Behavioral:   Denies mood changes, suicidal or homicidal ideations, hallucinations    Objective:   Blood pressure 122/73, pulse 63, height 5' 8"  (1.727 m), weight 192 lb 11.2 oz (87.4 kg), SpO2 98 %. Body mass index is 29.3 kg/m. General:  Well Developed, well nourished, appropriate for stated age.  Neuro:  Alert and oriented,  extra-ocular muscles intact  HEENT:  Normocephalic, atraumatic, neck supple Skin:  no gross rash, warm, pink. Cardiac:  RRR, S1 S2 Respiratory:  ECTA B/L and A/P, Not using accessory muscles, speaking in full sentences- unlabored. Vascular:  Ext warm, no  cyanosis apprec.; cap RF less 2 sec. Psych:  No HI/SI, judgement and insight good, Euthymic mood. Full Affect. Left Hip: ROM IR: 45 Deg, ER: 45 Deg, Flexion: 120 Deg, Extension: 100 Deg, Abduction: 45 Deg, Adduction: 45 Deg Strength IR: 5/5, ER: 5/5, Flexion: 5/5, Extension: 5/5, Abduction: 5/5, Adduction: 5/5 Pelvic alignment unremarkable to inspection and palpation. Greater trochanter without tenderness to palpation. Mild tenderness over piriformis. Equivocal FABERs. Left Lower Extremity: 5/5 strength, leg extension/flexion, hip extension/flexion, abduction/adduction, NVI distally, reflexes WNL and equal bilaterally, sensory intact to light touch and equal bilaterally.

## 2017-10-13 NOTE — Patient Instructions (Signed)
Please let us know if you have any problems with the steroid taper.  Please follow-up with physical therapy so you can develop a home exercise program to help prevent flexibility and strength inequities between your hips and legs bilaterally and help prevent pain like this in the future.  Also please use lose it app or my fitness pal app to track everything you put in your mouth and follow-up with me in 1 to 2 months so we can go over it.     Behavior Modification Ideas for Weight Management  Weight management involves adopting a healthy lifestyle that includes a knowledge of nutrition and exercise, a positive attitude and the right kind of motivation. Internal motives such as better health, increased energy, self-esteem and personal control increase your chances of lifelong weight management success.  Remember to have realistic goals and think long-term success. Believe in yourself and you can do it. The following information will give you ideas to help you meet your goals.  Control Your Home Environment  Eat only while sitting down at the kitchen or dining room table. Do not eat while watching television, reading, cooking, talking on the phone, standing at the refrigerator or working on the computer. Keep tempting foods out of the house - don't buy them. Keep tempting foods out of sight. Have low-calorie foods ready to eat. Unless you are preparing a meal, stay out of the kitchen. Have healthy snacks at your disposal, such as small pieces of fruit, vegetables, canned fruit, pretzels, low-fat string cheese and nonfat cottage cheese.  Control Your Work Environment  Do not eat at [employer] or keep tempting snacks at your desk. If you get hungry between meals, plan healthy snacks and bring them with you to work. During your breaks, go for a walk instead of eating. If you work around food, plan in advance the one item you will eat at mealtime. Make it inconvenient to nibble on food by  chewing gum, sugarless candy or drinking water or another low-calorie beverage. Do not work through meals. Skipping meals slows down metabolism and may result in overeating at the next meal. If food is available for special occasions, either pick the healthiest item, nibble on low-fat snacks brought from home, don't have anything offered, choose one option and have a small amount, or have only a beverage.  Control Your Mealtime Environment  Serve your plate of food at the stove or kitchen counter. Do not put the serving dishes on the table. If you do put dishes on the table, remove them immediately when finished eating. Fill half of your plate with vegetables, a quarter with lean protein and a quarter with starch. Use smaller plates, bowls and glasses. A smaller portion will look large when it is in a little dish. Politely refuse second helpings. When fixing your plate, limit portions of food to one scoop/serving or less.   Daily Food Management  Replace eating with another activity that you will not associate with food. Wait 20 minutes before eating something you are craving. Drink a large glass of water or diet soda before eating. Always have a big glass or bottle of water to drink throughout the day. Avoid high-calorie add-ons such as cream with your coffee, butter, mayonnaise and salad dressings.  Shopping: Do not shop when hungry or tired. Shop from a list and avoid buying anything that is not on your list. If you must have tempting foods, buy individual-sized packages and try to find a lower-calorie alternative.  Don't taste test in the store. Read food labels. Compare products to help you make the healthiest choices.  Preparation: Chew a piece of gum while cooking meals. Use a quarter teaspoon if you taste test your food. Try to only fix what you are going to eat, leaving yourself no chance for seconds. If you have prepared more food than you need, portion it into individual  containers and freeze or refrigerate immediately. Don't snack while cooking meals.  Eating: Eat slowly. Remember it takes about 20 minutes for your stomach to send a message to your brain that it is full. Don't let fake hunger make you think you need more. The ideal way to eat is to take a bite, put your utensil down, take a sip of water, cut your next bite, take a bit, put your utensil down and so on. Do not cut your food all at one time. Cut only as needed. Take small bites and chew your food well. Stop eating for a minute or two at least once during a meal or snack. Take breaks to reflect and have conversation.  Cleanup and Leftovers: Label leftovers for a specific meal or snack. Freeze or refrigerate individual portions of leftovers. Do not clean up if you are still hungry.  Eating Out and Social Eating  Do not arrive hungry. Eat something light before the meal. Try to fill up on low-calorie foods, such as vegetables and fruit, and eat smaller portions of the high-calorie foods. Eat foods that you like, but choose small portions. If you want seconds, wait at least 20 minutes after you have eaten to see if you are actually hungry or if your eyes are bigger than your stomach. Limit alcoholic beverages. Try a soda water with a twist of lime. Do not skip other meals in the day to save room for the special event.  At Restaurants: Order  la carte rather than buffet style. Order some vegetables or a salad for an appetizer instead of eating bread. If you order a high-calorie dish, share it with someone. Try an after-dinner mint with your coffee. If you do have dessert, share it with two or more people. Don't overeat because you do not want to waste food. Ask for a doggie bag to take extra food home. Tell the server to put half of your entree in a to go bag before the meal is served to you. Ask for salad dressing, gravy or high-fat sauces on the side. Dip the tip of your fork in the  dressing before each bite. If bread is served, ask for only one piece. Try it plain without butter or oil. At [employer] where oil and vinegar is served with bread, use only a small amount of oil and a lot of vinegar for dipping.  At a Friend[facility] House: Offer to bring a dish, appetizer or dessert that is low in calories. Serve yourself small portions or tell the host that you only want a small amount. Stand or sit away from the snack table. Stay away from the kitchen or stay busy if you are near the food. Limit your alcohol intake.  At [employer] and Cafeterias: Cover most of your plate with lettuce and/or vegetables. Use a salad plate instead of a dinner plate. After eating, clear away your dishes before having coffee or tea.  Entertaining at Home: Explore low-fat, low-cholesterol cookbooks. Use single-serving foods like chicken breasts or hamburger patties. Prepare low-calorie appetizers and desserts.   Holidays: Keep tempting foods out  of sight. Decorate the house without using food. Have low-calorie beverages and foods on hand for guests. Allow yourself one planned treat a day. Don't skip meals to save up for the holiday feast. Eat regular, planned meals.   Exercise Well  Make exercise a priority and a planned activity in the day. If possible, walk the entire or part of the distance to work. Get an exercise buddy. Go for a walk with a colleague during one of your breaks, go to the gym, run or take a walk with a friend, walk in the mall with a shopping companion. Park at the end of the parking lot and walk to the store or office entrance. Always take the stairs all of the way or at least part of the way to your floor. If you have a desk job, walk around the office frequently. Do leg lifts while sitting at your desk. Do something outside on the weekends like going for a hike or a bike ride.   Have a Healthy Attitude  Make health your weight management priority. Be  realistic. Have a goal to achieve a healthier you, not necessarily the lowest weight or ideal weight based on calculations or tables. Focus on a healthy eating style, not on dieting. Dieting usually lasts for a short amount of time and rarely produces long-term success. Think long term. You are developing new healthy behaviors to follow next month, in a year and in a decade.    This information is for educational purposes only and is not intended to replace the advice of your doctor or health care provider. We encourage you to discuss with your doctor any questions or concerns you may have.        Guidelines for Losing Weight   We want weight loss that will last so you should lose 1-2 pounds a week.  THAT IS IT! Please pick THREE things a month to change. Once it is a habit check off the item. Then pick another three items off the list to become habits.  If you are already doing a habit on the list GREAT!  Cross that item off!  Don't drink your calories. Ie, alcohol, soda, fruit juice, and sweet tea.   Drink more water. Drink a glass when you feel hungry or before each meal.   Eat breakfast - Complex carb and protein (likeDannon light and fit yogurt, oatmeal, fruit, eggs, [place] bacon).  Measure your cereal.  Eat no more than one cup a day. (ie Kashi)  Eat an apple a day.  Add a vegetable a day.  Try a new vegetable a month.  Use Pam! Stop using oil or butter to cook.  Don't finish your plate or use smaller plates.  Share your dessert.  Eat sugar free Jello for dessert or frozen grapes.  Don't eat 2-3 hours before bed.  Switch to whole wheat bread, pasta, and brown rice.  Make healthier choices when you eat out. No fries!  Pick baked chicken, NOT fried.  Don't forget to SLOW DOWN when you eat. It is not going anywhere.   Take the stairs.  Park far away in the parking lot  Lift soup cans (or weights) for 10 minutes while watching TV.  Walk at work for 10 minutes  during break.  Walk outside 1 time a week with your friend, kids, dog, or significant other.  Start a walking group at church.  Walk the mall as much as you can tolerate.   Keep a  food diary.  Weigh yourself daily.  Walk for 15 minutes 3 days per week.  Cook at home more often and eat out less. If life happens and you go back to old habits, it is okay.  Just start over. You can do it!  If you experience chest pain, get short of breath, or tired during the exercise, please stop immediately and inform your doctor.    Before you even begin to attack a weight-loss plan, it pays to remember this: You are not fat. You have fat. Losing weight isn't about blame or shame; it's simply another achievement to accomplish. Dieting is like any other skill-you have to buckle down and work at it. As long as you act in a smart, reasonable way, you'll ultimately get where you want to be. Here are some weight loss pearls for you.   1. It's Not a Diet. It's a Lifestyle Thinking of a diet as something you're on and suffering through only for the short term doesn't work. To shed weight and keep it off, you need to make permanent changes to the way you eat. It's OK to indulge occasionally, of course, but if you cut calories temporarily and then revert to your old way of eating, you'll gain back the weight quicker than you can say yo-yo. Use it to lose it. Research shows that one of the best predictors of long-term weight loss is how many pounds you drop in the first month. For that reason, nutritionists often suggest being stricter for the first two weeks of your new eating strategy to build momentum. Cut out added sugar and alcohol and avoid unrefined carbs. After that, figure out how you can reincorporate them in a way that's healthy and maintainable.  2. There's a Right Way to Exercise Working out burns calories and fat and boosts your metabolism by building muscle. But those trying to lose weight are notorious  for overestimating the number of calories they burn and underestimating the amount they take in. Unfortunately, your system is biologically programmed to hold on to extra pounds and that means when you start exercising, your body senses the deficit and ramps up its hunger signals. If you're not diligent, you'll eat everything you burn and then some. Use it, to lose it. Cardio gets all the exercise glory, but strength and interval training are the real heroes. They help you build lean muscle, which in turn increases your metabolism and calorie-burning ability 3. Don't Overreact to Mild Hunger Some people have a hard time losing weight because of hunger anxiety. To them, being hungry is bad-something to be avoided at all costs-so they carry snacks with them and eat when they don't need to. Others eat because they're stressed out or bored. While you never want to get to the point of being ravenous (that's when bingeing is likely to happen), a hunger pang, a craving, or the fact that it's 3:00 p.m. should not send you racing for the vending machine or obsessing about the energy bar in your purse. Ideally, you should put off eating until your stomach is growling and it's difficult to concentrate.  Use it to lose it. When you feel the urge to eat, use the HALT method. Ask yourself, Am I really hungry? Or am I angry or anxious, lonely or bored, or tired? If you're still not certain, try the apple test. If you're truly hungry, an apple should seem delicious; if it doesn't, something else is going on. Or you can try drinking  water and making yourself busy, if you are still hungry try a healthy snack.  4. Not All Calories Are Created Equal The mechanics of weight loss are pretty simple: Take in fewer calories than you use for energy. But the kind of food you eat makes all the difference. Processed food that's high in saturated fat and refined starch or sugar can cause inflammation that disrupts the hormone signals that  tell your brain you're full. The result: You eat a lot more.  Use it to lose it. Clean up your diet. Swap in whole, unprocessed foods, including vegetables, lean protein, and healthy fats that will fill you up and give you the biggest nutritional bang for your calorie buck. In a few weeks, as your brain starts receiving regular hunger and fullness signals once again, you'll notice that you feel less hungry overall and naturally start cutting back on the amount you eat.  5. Protein, Produce, and Plant-Based Fats Are Your Weight-Loss Trinity Here's why eating the three Ps regularly will help you drop pounds. Protein fills you up. You need it to build lean muscle, which keeps your metabolism humming so that you can torch more fat. People in a weight-loss program who ate double the recommended daily allowance for protein (about 110 grams for a 150-pound woman) lost 70 percent of their weight from fat, while people who ate the RDA lost only about 40 percent, one study found. Produce is packed with filling fiber. "It's very difficult to consume too many calories if you're eating a lot of vegetables. Example: Three cups of broccoli is a lot of food, yet only 93 calories. (Fruit is another story. It can be easy to overeat and can contain a lot of calories from sugar, so be sure to monitor your intake.) Plant-based fats like olive oil and those in avocados and nuts are healthy and extra satiating.  Use it to lose it. Aim to incorporate each of the three Ps into every meal and snack. People who eat protein throughout the day are able to keep weight off, according to a study in the [place] of Clinical Nutrition. In addition to meat, poultry and seafood, good sources are beans, lentils, eggs, tofu, and yogurt. As for fat, keep portion sizes in check by measuring out salad dressing, oil, and nut butters (shoot for one to two tablespoons). Finally, eat veggies or a little fruit at every meal. People who did that  consumed 308 fewer calories but didn't feel any hungrier than when they didn't eat more produce.  7. How You Eat Is As Important As What You Eat In order for your brain to register that you're full, you need to focus on what you're eating. Sit down whenever you eat, preferably at a table. Turn off the TV or computer, put down your phone, and look at your food. Smell it. Chew slowly, and don't put another bite on your fork until you swallow. When women ate lunch this attentively, they consumed 30 percent less when snacking later than those who listened to an audiobook at lunchtime, according to a study in the [place] of Nutrition. 8. Weighing Yourself Really Works The scale provides the best evidence about whether your efforts are paying off. Seeing the numbers tick up or down or stagnate is motivation to keep going-or to rethink your approach. A 2015 study at [facility] found that daily weigh-ins helped people lose more weight, keep it off, and maintain that loss, even after two years. Use  it to lose it. Step on the scale at the same time every day for the best results. If your weight shoots up several pounds from one weigh-in to the next, don't freak out. Eating a lot of salt the night before or having your period is the likely culprit. The number should return to normal in a day or two. It's a steady climb that you need to do something about. 9. Too Much Stress and Too Little Sleep Are Your Enemies When you're tired and frazzled, your body cranks up the production of cortisol, the stress hormone that can cause carb cravings. Not getting enough sleep also boosts your levels of ghrelin, a hormone associated with hunger, while suppressing leptin, a hormone that signals fullness and satiety. People on a diet who slept only five and a half hours a night for two weeks lost 55 percent less fat and were hungrier than those who slept eight and a half hours, according to a study in the [employer]. Use it to lose it. Prioritize sleep, aiming for seven hours or more a night, which research shows helps lower stress. And make sure you're getting quality zzz's. If a snoring spouse or a fidgety cat wakes you up frequently throughout the night, you may end up getting the equivalent of just four hours of sleep, according to a study from [facility]. Keep pets out of the bedroom, and use a white-noise app to drown out snoring. 10. You Will Hit a plateau-And You Can Bust Through It As you slim down, your body releases much less leptin, the fullness hormone.  If you're not strength training, start right now. Building muscle can raise your metabolism to help you overcome a plateau. To keep your body challenged and burning calories, incorporate new moves and more intense intervals into your workouts or add another sweat session to your weekly routine. Alternatively, cut an extra 100 calories or so a day from your diet. Now that you've lost weight, your body simply doesn't need as much fuel.    Since food equals calories, in order to lose weight you must either eat fewer calories, exercise more to burn off calories with activity, or both. Food that is not used to fuel the body is stored as fat. A major component of losing weight is to make smarter food choices. Here's how:  1)   Limit non-nutritious foods, such as: Sugar, honey, syrups and candy Pastries, donuts, pies, cakes and cookies Soft drinks, sweetened juices and alcoholic beverages  2)  Cut down on high-fat foods by: - Choosing poultry, fish or lean red meat - Choosing low-fat cooking methods, such as baking, broiling, steaming, grilling and boiling - Using low-fat or non-fat dairy products - Using vinaigrette, herbs, lemon or fat-free salad dressings - Avoiding fatty meats, such as bacon, sausage, franks, ribs and luncheon meats - Avoiding high-fat snacks like nuts, chips and chocolate - Avoiding fried  foods - Using less butter, margarine, oil and mayonnaise - Avoiding high-fat gravies, cream sauces and cream-based soups  3) Eat a variety of foods, including: - Fruit and vegetables that are raw, steamed or baked - Whole grains, breads, cereal, rice and pasta - Dairy products, such as low-fat or non-fat milk or yogurt, low-fat cottage cheese and low-fat cheese - Protein-rich foods like chicken, [place], fish, lean meat and legumes, or beans  4) Change your eating habits by: - Eat three balanced meals a day to help control your hunger - Watch  portion sizes and eat small servings of a variety of foods - Choose low-calorie snacks - Eat only when you are hungry and stop when you are satisfied - Eat slowly and try not to perform other tasks while eating - Find other activities to distract you from food, such as walking, taking up a hobby or being involved in the community - Include regular exercise in your daily routine ( minimum of 20 min of moderate-intensity exercise at least 5 days/week)  - Find a support group, if necessary, for emotional support in your weight loss journey           Easy ways to cut 100 calories   1. Eat your eggs with hot sauce OR salsa instead of cheese.  Eggs are great for breakfast, but many people consider eggs and cheese to be BFFs. Instead of cheese-1 oz. of cheddar has 114 calories-top your eggs with hot sauce, which contains no calories and helps with satiety and metabolism. Salsa is also a great option!!  2. Top your toast, waffles or pancakes with fresh berries instead of jelly or syrup. Half a cup of berries-fresh, frozen or thawed-has about 40 calories, compared with 2 tbsp. of maple syrup or jelly, which both have about 100 calories. The berries will also give you a good punch of fiber, which helps keep you full and satisfied and won't spike blood sugar quickly like the jelly or syrup. 3. Swap the non-fat latte for black coffee with a splash of  half-and-half. Contrary to its name, that non-fat latte has 130 calories and a startling 19g of carbohydrates per 16 oz. serving. Replacing that 'light' drinkable dessert with a black coffee with a splash of half-and-half saves you more than 100 calories per 16 oz. serving. 4. Sprinkle salads with freeze-dried raspberries instead of dried cranberries. If you want a sweet addition to your nutritious salad, stay away from dried cranberries. They have a whopping 130 calories per  cup and 30g carbohydrates. Instead, sprinkle freeze-dried raspberries guilt-free and save more than 100 calories per  cup serving, adding 3g of belly-filling fiber. 5. Go for mustard in place of mayo on your sandwich. Mustard can add really nice flavor to any sandwich, and there are tons of varieties, from spicy to honey. A serving of mayo is 95 calories, versus 10 calories in a serving of mustard.  Or try an avocado mayo spread: You can find the recipe few click this link: https://www.californiaavocado.com/recipes/recipe-container/[place]-avocado-mayo 6. Choose a DIY salad dressing instead of the store-bought kind. Mix Dijon or whole grain mustard with low-fat Kefir or red wine vinegar and garlic. 7. Use hummus as a spread instead of a dip. Use hummus as a spread on a high-fiber cracker or tortilla with a sandwich and save on calories without sacrificing taste. 8. Pick just one salad "accessory." Salad isn't automatically a calorie winner. It's easy to over-accessorize with toppings. Instead of topping your salad with nuts, avocado and cranberries (all three will clock in at 313 calories), just pick one. The next day, choose a different accessory, which will also keep your salad interesting. You don't wear all your jewelry every day, right? 9. Ditch the white pasta in favor of spaghetti squash. One cup of cooked spaghetti squash has about 40 calories, compared with traditional spaghetti, which comes with more than 200.  Spaghetti squash is also nutrient-dense. It's a good source of fiber and Vitamins A and C, and it can be eaten just like you would eat pasta-with a  great tomato sauce and [place] meatballs or with pesto, tofu and spinach, for example. 10. Dress up your chili, soups and stews with non-fat [place] yogurt instead of sour cream. Just a 'dollop' of sour cream can set you back 115 calories and a whopping 12g of fat-seven of which are of the artery-clogging variety. Added bonus: [place] yogurt is packed with muscle-building protein, calcium and B Vitamins. 11. Mash cauliflower instead of mashed potatoes. One cup of traditional mashed potatoes-in all their creamy goodness-has more than 200 calories, compared to mashed cauliflower, which you can typically eat for less than 100 calories per 1 cup serving. Cauliflower is a great source of the antioxidant indole-3-carbinol (I3C), which may help reduce the risk of some cancers, like breast cancer. 12. Ditch the ice cream sundae in favor of a [place] yogurt parfait. Instead of a cup of ice cream or fro-yo for dessert, try 1 cup of nonfat Greek yogurt topped with fresh berries and a sprinkle of cacao nibs. Both toppings are packed with antioxidants, which can help reduce cellular inflammation and oxidative damage. And the comparison is a no-brainer: One cup of ice cream has about 275 calories; one cup of frozen yogurt has about 230; and a cup of Greek yogurt has just 130, plus twice the protein, so you're less likely to return to the freezer for a second helping. 13. Put olive oil in a spray container instead of using it directly from the bottle. Each tablespoon of olive oil is 120 calories and 15g of fat. Use a mister instead of pouring it straight into the pan or onto a salad. This allows for portion control and will save you more than 100 calories. 14. When baking, substitute canned pumpkin for butter or oil. Canned pumpkin-not pumpkin pie mix-is loaded with Vitamin A, which  is important for skin and eye health, as well as immunity. And the comparisons are pretty crazy:  cup of canned pumpkin has about 40 calories, compared to butter or oil, which has more than 800 calories. Yes, 800 calories. Applesauce and mashed banana can also serve as good substitutions for butter or oil, usually in a 1:1 ratio. 15. Top casseroles with high-fiber cereal instead of breadcrumbs. Breadcrumbs are typically made with white bread, while breakfast cereals contain 5-9g of fiber per serving. Not only will you save more than 150 calories per  cup serving, the swap will also keep you more full and you'll get a metabolism boost from the added fiber. 16. Snack on pistachios instead of macadamia nuts. Believe it or not, you get the same amount of calories from 35 pistachios (100 calories) as you would from only five macadamia nuts. 17. Chow down on kale chips rather than potato chips. This is my favorite 'don't knock it 'till you try it' swap. Kale chips are so easy to make at home, and you can spice them up with a little grated parmesan or chili powder. Plus, they're a mere fraction of the calories of potato chips, but with the same crunch factor we crave so often. 18. Add seltzer and some fruit slices to your cocktail instead of soda or fruit juice. One cup of soda or fruit juice can pack on as much as 140 calories. Instead, use seltzer and fruit slices. The fruit provides valuable phytochemicals, such as flavonoids and anthocyanins, which help to combat cancer and stave off the aging process.

## 2017-10-16 ENCOUNTER — Other Ambulatory Visit: Payer: Self-pay | Admitting: Obstetrics

## 2017-10-16 DIAGNOSIS — R921 Mammographic calcification found on diagnostic imaging of breast: Secondary | ICD-10-CM

## 2017-10-16 DIAGNOSIS — Z1231 Encounter for screening mammogram for malignant neoplasm of breast: Secondary | ICD-10-CM

## 2017-10-23 ENCOUNTER — Ambulatory Visit: Payer: BLUE CROSS/BLUE SHIELD | Attending: Family Medicine | Admitting: Physical Therapy

## 2017-10-23 ENCOUNTER — Encounter: Payer: Self-pay | Admitting: Physical Therapy

## 2017-10-23 DIAGNOSIS — M25552 Pain in left hip: Secondary | ICD-10-CM | POA: Diagnosis not present

## 2017-10-23 DIAGNOSIS — M25551 Pain in right hip: Secondary | ICD-10-CM

## 2017-10-23 NOTE — Therapy (Signed)
[facility] [address] [place], [address] Phone: [phone]   Fax:  [phone]  Physical Therapy Treatment  Patient Details  Name: Kathleen Bowen MRN: [id number] Date of Birth: [birth year] No data recorded  Encounter Date: 10/23/2017    Past Medical History:  Diagnosis Date  . Heart disease   . Heart murmur   . Hx of PTL (preterm labor), current pregnancy     Past Surgical History:  Procedure Laterality Date  . ASD REPAIR  1992  . BREAST BIOPSY Right 05/14/2015   Benign  . HEMORROIDECTOMY    . MOHS SURGERY     PRECANCEROUS SKIN REMOVED  . TONSILLECTOMY      There were no vitals filed for this visit.   ROM All lumbar motions wnl w/ pain w/ L lateral bending on L LB  Hip motions restricted   Strength Hip flex: 5/5 bilat Hip abd 4/5 bilat Hip Ext in prone: 4/5 bilat Hip IR: 5/5 bilat Hip ER: 4+/5 bilat Knee flex 5/5 bilat Knee ext 5/5 bilat Ankle DF 5/5 bilat   Special Tests/ Other Reflexes 2+ bilat patella and achielles reflex (-) slump bilat (-) SLR (-) Crossed SLR (-) Elys bilat (+) 90/90 bilat for hamstring tightness (-) FABER bilat  (+) FADIR bilat for concordant pain at GT (-) Obers bilat (-) SI cluster (-) Leg length discrepancy  (+) Thomas stretch bilat Concordant sign with palpation over bilat GT 5xSTS 6.6sec   Ther-Ex - Seated glute stretch 30sec hold (HEP) - Seated hamstring stretch 30sec hold (HEP)  - Half kneeling hip flexor stretch 30sec hold (HEP) - Bridge exercise x10 with cuing for glute contraction/activation -Education for RICE protocol for 2 weeks for sx of bursitis/tendinitis -Education on PT role and there-ex role on pain and strengthening/lengthening muscles surrounding the hip to increase stability and decrease pain - Education on footwear and importance of support at ankles for decreased pain throughout LE chain with walking for  exercise                                          Patient will benefit from skilled therapeutic intervention in order to improve the following deficits and impairments:     Visit Diagnosis: No diagnosis found.     Problem List Patient Active Problem List   Diagnosis Date Noted  . Abnormal mammogram of right breast 10/27/2016  . SK (solar keratosis) 10/27/2016  . Status post device closure of ASD- with [phone] 10/27/2016  . Chronic sinusitis-  10/27/2016  . Family history of coronary artery disease in brother 10/27/2016  . 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 Family history of malignant melanoma of skin- sis around age 76ish 10/27/2016  . Obesity (BMI 30-39.9) 10/27/2016   Shelton Silvas PT, DPT Shelton Silvas 10/23/2017, 8:59 AM  [facility] [address] [place], [address] Phone: [phone]   Fax:  [phone]  Name: MEAGHAN WHISTLER MRN: [id number] Date of Birth: [birth year]

## 2017-10-26 ENCOUNTER — Ambulatory Visit: Payer: BLUE CROSS/BLUE SHIELD | Admitting: Physical Therapy

## 2017-10-26 ENCOUNTER — Encounter: Payer: Self-pay | Admitting: Physical Therapy

## 2017-10-26 DIAGNOSIS — M25552 Pain in left hip: Secondary | ICD-10-CM

## 2017-10-26 NOTE — Therapy (Signed)
[facility] [address] [place], [address] Phone: [phone]   Fax:  [phone]  Physical Therapy Treatment  Patient Details  Name: Kathleen Bowen MRN: [id number] Date of Birth: 05-23-1970 Referring Provider: Raliegh Scarlet MD   Encounter Date: 10/26/2017  PT End of Session - 10/26/17 1352    Visit Number  2    Number of Visits  17    Date for PT Re-Evaluation  12/18/17    PT Start Time  0104    PT Stop Time  0145    PT Time Calculation (min)  41 min    Activity Tolerance  Patient tolerated treatment well    Behavior During Therapy  [facility] for tasks assessed/performed       Past Medical History:  Diagnosis Date  . Heart disease   . Heart murmur   . Hx of PTL (preterm labor), current pregnancy     Past Surgical History:  Procedure Laterality Date  . ASD REPAIR  1992  . BREAST BIOPSY Right 05/14/2015   Benign  . HEMORROIDECTOMY    . MOHS SURGERY     PRECANCEROUS SKIN REMOVED  . TONSILLECTOMY      There were no vitals filed for this visit.  Subjective Assessment - 10/26/17 1303    Subjective  Patient reports she is only having foot numbess in L foot today. Patient reports she has been following her RICE protocol, which she thinks is helping. Patient reports minimal pain with some "soreness".     Pertinent History  Patient is a 47 year old female presenting with bilat hip soreness over the past "couple months". Patient reports she is walking 3x/week 20-37mn at a time. Patient reports over the past couple weeks she has realized some foot numbness after walking 20 mins that she reports is not painful, just uncomfortable L>R. Patient denies electical, burning sensation, or posterior "shooting" down the LE's, just that the bottoms of her feet go numb. Patient reports she was on a predinsone taper and that helped with the bottom of the feet numbess, and now that she is off of it she is experiencing this  bottom of the feet numbess (which she describes as her feet are asleep ) all the time L>R. Patient reports pain in bilat hips when rising from the floor, that goes away after she stands. Worst pain in the past week 2/10 and best 0/10. Pt denies N/V, unexplained weight fluctuation, saddle paresthesia, fever, B&B changes, night sweats, or unrelenting night pain at this time.    Limitations  Sitting;Walking;Lifting    How long can you sit comfortably?  10-15 min    How long can you stand comfortably?  22m    How long can you walk comfortably?  2011m   Diagnostic tests  None at this time    Patient Stated Goals  Walking without feet tingling, stand from floor without pain    Pain Onset  More than a month ago       Ther-Ex - Hamstring stretch (HEP review) 30sec hold each side - Glute stretch (HEP review) 30sec hold each side with min cuing for proper stretch form - Half kneeling hip flexor stretch 30sec hold each with min cuing for lateral bending component - Bridge x 10; adding glute set + 10x repetitions to elicit better glute contraction   Manual - Piriformis stretch 4x 30sec holds each side increasing ROM as able - Obers stretch 4x 30sec holds  each side increasing ROM as able with patient reporting ant thigh and glute stretch sensation - STM with trigger point release to R glute med/max/piriformis (increased time spent here d/t increased trigger points/cheif c/o pain)  Following manual techniques patient reports no foot "tingling/numbness"                     PT Education - 10/26/17 1420    Education Details  DN education for future visit    Person(s) Educated  Patient    Methods  Explanation;Tactile cues;Verbal cues    Comprehension  Verbalized understanding;Returned demonstration;Verbal cues required       PT Short Term Goals - 10/23/17 1214      PT SHORT TERM GOAL #1   Title  Pt will be independent with HEP in order to improve strength and flexibility in order  to improve function at home and work.    Time  4    Period  Weeks    Status  New        PT Long Term Goals - 10/25/17 1305      PT LONG TERM GOAL #1   Title  Pt will increase hip ext strength by at least 1/2 MMT grade in order to demonstrate improvement in strength and function    Baseline  10/25/17 4/5    Time  8    Period  Weeks    Status  New      PT LONG TERM GOAL #2   Title  Patient will report no pain with walking for exercise to return to PLOF    Baseline  10/25/17: Patient reports 2/10 pain and bilat foot "tingling" with walking for exercise    Time  8    Period  Weeks    Status  New            Plan - 10/26/17 1433    Clinical Impression Statement  PT led patient through HE Preview with patient requiring only minor corrections for proper form of therex. PT utilized manual techniques to relieve tension in L hip extensor/ER; following patient is impressed that she has no numbness/tingling sx in bilat feet, as well as reduced glute tension. PT mentioned TDN to patient for relief of trigger points, next visist. Patient verbalized understanding of all education provided.     Rehab Potential  Good    Clinical Impairments Affecting Rehab Potential  (+) motivation to pursue more active lifestyle, fairly young age, social support (-) scheduling    PT Frequency  2x / week    PT Duration  8 weeks    PT Treatment/Interventions  Neuromuscular re-education;Passive range of motion;Manual techniques;Dry needling;Patient/family education;Therapeutic activities;Balance training;Therapeutic exercise;Electrical Stimulation;Cryotherapy;Ultrasound;Traction;Moist Heat;Functional mobility training;Aquatic Therapy    PT Next Visit Plan  FOTO, HEP review, post hip strengthening/hip mobs and manual for soft tissue restrictions    Consulted and Agree with Plan of Care  Patient       Patient will benefit from skilled therapeutic intervention in order to improve the following deficits and  impairments:  Abnormal gait, Increased fascial restricitons, Impaired sensation, Improper body mechanics, Pain, Impaired tone, Postural dysfunction, Increased muscle spasms, Decreased mobility, Decreased activity tolerance, Decreased range of motion, Decreased strength, Difficulty walking, Impaired flexibility  Visit Diagnosis: Pain in left hip     Problem List Patient Active Problem List   Diagnosis Date Noted  . Abnormal mammogram of right breast 10/27/2016  . SK (solar keratosis) 10/27/2016  . Status post device closure  of ASD- with [phone] 10/27/2016  . Chronic sinusitis-  10/27/2016  . Family history of coronary artery disease in brother 10/27/2016  . 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 Family history of malignant melanoma of skin- sis around age 3ish 10/27/2016  . Obesity (BMI 30-39.9) 10/27/2016   Shelton Silvas PT, DPT Shelton Silvas 10/26/2017, 3:45 PM  [facility] [address] [place], [address] Phone: [phone]   Fax:  [phone]  Name: Kathleen Bowen MRN: [id number] Date of Birth: 22-Jan-1971

## 2017-10-29 ENCOUNTER — Encounter: Payer: Self-pay | Admitting: Physical Therapy

## 2017-10-29 ENCOUNTER — Ambulatory Visit: Payer: BLUE CROSS/BLUE SHIELD | Admitting: Physical Therapy

## 2017-10-29 DIAGNOSIS — M25552 Pain in left hip: Secondary | ICD-10-CM

## 2017-10-29 NOTE — Therapy (Signed)
[facility] [address] [place], [address] Phone: [phone]   Fax:  [phone]  Physical Therapy Treatment  Patient Details  Name: Kathleen Bowen MRN: [id number] Date of Birth: [birth year] Referring Provider: Raliegh Scarlet MD   Encounter Date: 10/29/2017  PT End of Session - 10/29/17 1556    Visit Number  3    Number of Visits  17    Date for PT Re-Evaluation  12/18/17    PT Start Time  0315    PT Stop Time  0400    PT Time Calculation (min)  45 min    Activity Tolerance  Patient tolerated treatment well    Behavior During Therapy  [facility] for tasks assessed/performed       Past Medical History:  Diagnosis Date  . Heart disease   . Heart murmur   . Hx of PTL (preterm labor), current pregnancy     Past Surgical History:  Procedure Laterality Date  . ASD REPAIR  1992  . BREAST BIOPSY Right 05/14/2015   Benign  . HEMORROIDECTOMY    . MOHS SURGERY     PRECANCEROUS SKIN REMOVED  . TONSILLECTOMY      There were no vitals filed for this visit.  Subjective Assessment - 10/29/17 1525    Subjective  Patient continues to report decreased numbness/tingling in L foot, with none in R foot. Patient reports no pain today. Patient is continuing to follow RICE protocol and HEP.     Pertinent History  Patient is a 47 year old female presenting with bilat hip soreness over the past "couple months". Patient reports she is walking 3x/week 20-41mn at a time. Patient reports over the past couple weeks she has realized some foot numbness after walking 20 mins that she reports is not painful, just uncomfortable L>R. Patient denies electical, burning sensation, or posterior "shooting" down the LE's, just that the bottoms of her feet go numb. Patient reports she was on a predinsone taper and that helped with the bottom of the feet numbess, and now that she is off of it she is experiencing this bottom of the feet numbess (which  she describes as her feet are asleep ) all the time L>R. Patient reports pain in bilat hips when rising from the floor, that goes away after she stands. Worst pain in the past week 2/10 and best 0/10. Pt denies N/V, unexplained weight fluctuation, saddle paresthesia, fever, B&B changes, night sweats, or unrelenting night pain at this time.    Limitations  Sitting;Walking;Lifting    How long can you sit comfortably?  10-15 min    How long can you stand comfortably?  266m    How long can you walk comfortably?  2030m   Diagnostic tests  None at this time    Patient Stated Goals  Walking without feet tingling, stand from floor without pain    Pain Onset  More than a month ago       Ther-Ex - Bridge with red tband at knees 3x 10; with glute set prior to  repetitions to elicit better glute contraction -Clamshell 2x 10 bilat with red tband with TC initially to prevent hip rotation compensation, with good carry over following -Standing hip abd 2x 10 bilat with red tband with TC initially to lateral bending compensation, with good carry over following   Manual - Piriformis stretch 4x 30sec holds each side increasing ROM as able - Glute/LB stretch 4x 30sec  holds each side increasing ROM as able with patient reporting ant thigh and glute stretch sensation - Hamstring contract relax 10sec contract 20sec relax x4 each side inc ROM as able each rep  - STM with trigger point release to R glute med/max/piriformis (increased time spent here d/t increased trigger points/cheif c/o pain)  Following manual techniques patient reports no foot "tingling/numbness"                          PT Education - 10/29/17 1555    Education Details  DN education; exercise form    Person(s) Educated  Patient    Methods  Explanation;Verbal cues;Tactile cues    Comprehension  Verbalized understanding;Returned demonstration;Verbal cues required;Tactile cues required       PT Short Term Goals -  10/23/17 1214      PT SHORT TERM GOAL #1   Title  Pt will be independent with HEP in order to improve strength and flexibility in order to improve function at home and work.    Time  4    Period  Weeks    Status  New        PT Long Term Goals - 10/29/17 1604      PT LONG TERM GOAL #1   Title  Pt will increase hip ext strength by at least 1/2 MMT grade in order to demonstrate improvement in strength and function    Baseline  10/25/17 4/5    Time  8    Period  Weeks    Status  New      PT LONG TERM GOAL #2   Title  Patient will report no pain with walking for exercise to return to PLOF    Baseline  10/25/17: Patient reports 2/10 pain and bilat foot "tingling" with walking for exercise    Time  8    Period  Weeks    Status  New      PT LONG TERM GOAL #3   Title  Patient will increase FOTO score to 76 to demonstrate predicted increase in functional mobility to complete ADLs    Baseline  10/25/17: 64    Time  8    Period  Weeks    Status  New            Plan - 10/29/17 1601    Clinical Impression Statement  PT continued to utilize manual techniques with addition of DN for glute tension, with patient demonstrating increased ROM and decreased pain following. PT utilized low level strengthening of for glutes to increase recruitment without increasing tension from severe muscle fatigue. Patient was able to complete therex with accuracy with PT cuing, demonstrating good carry over between sets.     Rehab Potential  Good    Clinical Impairments Affecting Rehab Potential  (+) motivation to pursue more active lifestyle, fairly young age, social support (-) scheduling    PT Frequency  2x / week    PT Duration  8 weeks    PT Treatment/Interventions  Neuromuscular re-education;Passive range of motion;Manual techniques;Dry needling;Patient/family education;Therapeutic activities;Balance training;Therapeutic exercise;Electrical Stimulation;Cryotherapy;Ultrasound;Traction;Moist  Heat;Functional mobility training;Aquatic Therapy    PT Next Visit Plan  post hip strengthening/hip mobs and manual for soft tissue restrictions    PT Home Exercise Plan  half kneeling stretch, glute stretch, bridge, hamstring stretch    Consulted and Agree with Plan of Care  Patient       Patient will benefit from skilled therapeutic intervention  in order to improve the following deficits and impairments:  Abnormal gait, Increased fascial restricitons, Impaired sensation, Improper body mechanics, Pain, Impaired tone, Postural dysfunction, Increased muscle spasms, Decreased mobility, Decreased activity tolerance, Decreased range of motion, Decreased strength, Difficulty walking, Impaired flexibility  Visit Diagnosis: Pain in left hip     Problem List Patient Active Problem List   Diagnosis Date Noted  . Abnormal mammogram of right breast 10/27/2016  . SK (solar keratosis) 10/27/2016  . Status post device closure of ASD- with [phone] 10/27/2016  . Chronic sinusitis-  10/27/2016  . Family history of coronary artery disease in brother 10/27/2016  . 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 Family history of malignant melanoma of skin- sis around age 51ish 10/27/2016  . Obesity (BMI 30-39.9) 10/27/2016   Shelton Silvas PT, DPT Shelton Silvas 10/29/2017, 4:19 PM  [facility] [address] [place], [address] Phone: [phone]   Fax:  [phone]  Name: Kathleen Bowen MRN: [id number] Date of Birth: 1970/09/23

## 2017-11-04 ENCOUNTER — Encounter: Payer: Self-pay | Admitting: Physical Therapy

## 2017-11-04 ENCOUNTER — Ambulatory Visit: Payer: BLUE CROSS/BLUE SHIELD | Admitting: Physical Therapy

## 2017-11-04 DIAGNOSIS — M25552 Pain in left hip: Secondary | ICD-10-CM

## 2017-11-04 NOTE — Therapy (Signed)
[place] [facility] [address] [place], [address] Phone: [phone]   Fax:  [phone]  Physical Therapy Treatment  Patient Details  Name: Kathleen Bowen MRN: [id number] Date of Birth: 1971/02/26 Referring Provider: Raliegh Scarlet MD   Encounter Date: 11/04/2017  PT End of Session - 11/04/17 1647    Visit Number  4    Number of Visits  17    Date for PT Re-Evaluation  12/18/17    PT Start Time  0417    PT Stop Time  0455    PT Time Calculation (min)  38 min    Activity Tolerance  Patient tolerated treatment well    Behavior During Therapy  [facility] for tasks assessed/performed       Past Medical History:  Diagnosis Date  . Heart disease   . Heart murmur   . Hx of PTL (preterm labor), current pregnancy     Past Surgical History:  Procedure Laterality Date  . ASD REPAIR  1992  . BREAST BIOPSY Right 05/14/2015   Benign  . HEMORROIDECTOMY    . MOHS SURGERY     PRECANCEROUS SKIN REMOVED  . TONSILLECTOMY      There were no vitals filed for this visit.  Subjective Assessment - 11/04/17 1619    Subjective  Patient reports she thinks TDN helped last session. Patient reports no numbness/tingling, but only some bilat lateral hip soreness, that she believes is from therex. Patient reports some LBP yesterday, but she was standing all day yesterday, which she thinks is why she is having that pain. Patient reports some pain relief from this. Patient reportrs compliance with HEP with no questions or concerns.     Pertinent History  Patient is a 47 year old female presenting with bilat hip soreness over the past "couple months". Patient reports she is walking 3x/week 20-97mn at a time. Patient reports over the past couple weeks she has realized some foot numbness after walking 20 mins that she reports is not painful, just uncomfortable L>R. Patient denies electical, burning sensation, or posterior "shooting" down the LE's,  just that the bottoms of her feet go numb. Patient reports she was on a predinsone taper and that helped with the bottom of the feet numbess, and now that she is off of it she is experiencing this bottom of the feet numbess (which she describes as her feet are asleep ) all the time L>R. Patient reports pain in bilat hips when rising from the floor, that goes away after she stands. Worst pain in the past week 2/10 and best 0/10. Pt denies N/V, unexplained weight fluctuation, saddle paresthesia, fever, B&B changes, night sweats, or unrelenting night pain at this time.    Limitations  Sitting;Walking;Lifting        Manual - Piriformis stretch 4x 30sec holds each side increasing ROM as able - Glute/LB stretch 4x 30sec holds each side increasing ROM as able with patient reporting ant thigh and glute stretch sensation - STM withtrigger point releaseto bilat glute med/max/piriformis (increased time spent here d/t increased trigger points/cheif c/o pain)  - L4- S3 grade I-II mobilization 30sec bouts 4 bouts each segment for LB pain reduction    ESTIM + heat pack HiVolt ESTIM 10 min at patient tolerated 135V increased to 140V through treatment at bilat glute/piriformis area . Attempted to decrease muscle tension in this area. With PT assessing patient tolerance throughout (increasing intensity as needed), monitoring skin integrity (normal), with  decreased pain noted from patient                       PT Education - 11/04/17 1647    Education Details  ESTIM education    Person(s) Educated  Patient    Methods  Explanation    Comprehension  Verbalized understanding       PT Short Term Goals - 10/23/17 1214      PT SHORT TERM GOAL #1   Title  Pt will be independent with HEP in order to improve strength and flexibility in order to improve function at home and work.    Time  4    Period  Weeks    Status  New        PT Long Term Goals - 10/29/17 1604      PT LONG TERM GOAL  #1   Title  Pt will increase hip ext strength by at least 1/2 MMT grade in order to demonstrate improvement in strength and function    Baseline  10/25/17 4/5    Time  8    Period  Weeks    Status  New      PT LONG TERM GOAL #2   Title  Patient will report no pain with walking for exercise to return to PLOF    Baseline  10/25/17: Patient reports 2/10 pain and bilat foot "tingling" with walking for exercise    Time  8    Period  Weeks    Status  New      PT LONG TERM GOAL #3   Title  Patient will increase FOTO score to 76 to demonstrate predicted increase in functional mobility to complete ADLs    Baseline  10/25/17: 64    Time  8    Period  Weeks    Status  New            Plan - 11/05/17 1059    Clinical Impression Statement  PT held off therex as patient beleives this gave her some increased soreness. PT utilized manual + modality techniquest o decrease pain, which patient responded well to, with decreased pain following. Patient is continuing to report decreased numbess and tingling between sessions, which she is pleased with.     Rehab Potential  Good    Clinical Impairments Affecting Rehab Potential  (+) motivation to pursue more active lifestyle, fairly young age, social support (-) scheduling    PT Frequency  2x / week    PT Duration  8 weeks    PT Treatment/Interventions  Neuromuscular re-education;Passive range of motion;Manual techniques;Dry needling;Patient/family education;Therapeutic activities;Balance training;Therapeutic exercise;Electrical Stimulation;Cryotherapy;Ultrasound;Traction;Moist Heat;Functional mobility training;Aquatic Therapy    PT Next Visit Plan  post hip strengthening/hip mobs and manual for soft tissue restrictions    PT Home Exercise Plan  half kneeling stretch, glute stretch, bridge, hamstring stretch    Consulted and Agree with Plan of Care  Patient       Patient will benefit from skilled therapeutic intervention in order to improve the  following deficits and impairments:  Abnormal gait, Increased fascial restricitons, Impaired sensation, Improper body mechanics, Pain, Impaired tone, Postural dysfunction, Increased muscle spasms, Decreased mobility, Decreased activity tolerance, Decreased range of motion, Decreased strength, Difficulty walking, Impaired flexibility  Visit Diagnosis: Pain in left hip     Problem List Patient Active Problem List   Diagnosis Date Noted  . Abnormal mammogram of right breast 10/27/2016  . SK (solar keratosis) 10/27/2016  .  Status post device closure of ASD- with [phone] 10/27/2016  . Chronic sinusitis-  10/27/2016  . Family history of coronary artery disease in brother 10/27/2016  . 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 Family history of malignant melanoma of skin- sis around age 69ish 10/27/2016  . Obesity (BMI 30-39.9) 10/27/2016   Shelton Silvas PT, DPT Shelton Silvas 11/05/2017, 11:04 AM  [place] [facility] [address], [place], [address] Phone: [phone]   Fax:  [phone]  Name: Kathleen Bowen MRN: [id number] Date of Birth: [birth year]

## 2017-11-10 ENCOUNTER — Ambulatory Visit: Payer: BLUE CROSS/BLUE SHIELD | Attending: Family Medicine | Admitting: Physical Therapy

## 2017-11-10 ENCOUNTER — Encounter: Payer: Self-pay | Admitting: Physical Therapy

## 2017-11-10 DIAGNOSIS — M545 Low back pain: Secondary | ICD-10-CM | POA: Diagnosis present

## 2017-11-10 DIAGNOSIS — G8929 Other chronic pain: Secondary | ICD-10-CM | POA: Diagnosis present

## 2017-11-10 DIAGNOSIS — M25552 Pain in left hip: Secondary | ICD-10-CM | POA: Diagnosis present

## 2017-11-10 NOTE — Therapy (Signed)
[place] [facility] [address], [place], [address] Phone: [phone]   Fax:  [phone]  Physical Therapy Treatment  Patient Details  Name: Kathleen Bowen MRN: [id number] Date of Birth: Aug 23, 1970 Referring Provider: Raliegh Scarlet MD   Encounter Date: 11/10/2017  PT End of Session - 11/10/17 1626    Visit Number  5    Number of Visits  17    Date for PT Re-Evaluation  12/18/17    PT Start Time  0420    PT Stop Time  0505    PT Time Calculation (min)  45 min    Activity Tolerance  Patient tolerated treatment well    Behavior During Therapy  [facility] for tasks assessed/performed       Past Medical History:  Diagnosis Date  . Heart disease   . Heart murmur   . Hx of PTL (preterm labor), current pregnancy     Past Surgical History:  Procedure Laterality Date  . ASD REPAIR  1992  . BREAST BIOPSY Right 05/14/2015   Benign  . HEMORROIDECTOMY    . MOHS SURGERY     PRECANCEROUS SKIN REMOVED  . TONSILLECTOMY      There were no vitals filed for this visit.  Subjective Assessment - 11/10/17 1623    Subjective  Patient reports L foot numbness that began today. Patient reports minimal pain in the L hip/low back. Patient reports compliance with HEP with no questions or concerns.     Pertinent History  Patient is a 47 year old female presenting with bilat hip soreness over the past "couple months". Patient reports she is walking 3x/week 20-24mn at a time. Patient reports over the past couple weeks she has realized some foot numbness after walking 20 mins that she reports is not painful, just uncomfortable L>R. Patient denies electical, burning sensation, or posterior "shooting" down the LE's, just that the bottoms of her feet go numb. Patient reports she was on a predinsone taper and that helped with the bottom of the feet numbess, and now that she is off of it she is experiencing this bottom of the feet numbess (which she  describes as her feet are asleep ) all the time L>R. Patient reports pain in bilat hips when rising from the floor, that goes away after she stands. Worst pain in the past week 2/10 and best 0/10. Pt denies N/V, unexplained weight fluctuation, saddle paresthesia, fever, B&B changes, night sweats, or unrelenting night pain at this time.    Limitations  Sitting;Walking;Lifting    How long can you sit comfortably?  10-15 min    How long can you stand comfortably?  280m    How long can you walk comfortably?  2035m   Diagnostic tests  None at this time    Patient Stated Goals  Walking without feet tingling, stand from floor without pain    Pain Onset  More than a month ago       Manual - Piriformis stretch 4x 30sec holds each side increasing ROM as able -Glute/LBstretch 4x 30sec holds each side increasing ROM as able with patient reporting ant thigh and glute stretch sensation - STM withtrigger point releaseto bilat glute med/max/piriformis (increased time spent here d/t increased trigger points/cheif c/o pain)  - L4- S3 grade I-II mobilization 30sec bouts 4 bouts each segment for LB pain reduction   ESTIM+ heat packHiVolt ESTIM10 min at patient tolerated135Vincreased to140V through treatmentat bilat glute/piriformisarea. Attempted to  decrease muscle tension in this area. With PT assessing patient tolerance throughout (increasing intensity as needed), monitoring skin integrity (normal), with decreased pain noted from patient     Ther-Ex - demonstration of sciatic nerve glide with instruction to complete                      PT Education - 11/10/17 1625    Education Details  adding in walking in graded approach    Person(s) Educated  Patient    Methods  Explanation    Comprehension  Verbalized understanding       PT Short Term Goals - 10/23/17 1214      PT SHORT TERM GOAL #1   Title  Pt will be independent with HEP in order to improve strength and  flexibility in order to improve function at home and work.    Time  4    Period  Weeks    Status  New        PT Long Term Goals - 10/29/17 1604      PT LONG TERM GOAL #1   Title  Pt will increase hip ext strength by at least 1/2 MMT grade in order to demonstrate improvement in strength and function    Baseline  10/25/17 4/5    Time  8    Period  Weeks    Status  New      PT LONG TERM GOAL #2   Title  Patient will report no pain with walking for exercise to return to PLOF    Baseline  10/25/17: Patient reports 2/10 pain and bilat foot "tingling" with walking for exercise    Time  8    Period  Weeks    Status  New      PT LONG TERM GOAL #3   Title  Patient will increase FOTO score to 76 to demonstrate predicted increase in functional mobility to complete ADLs    Baseline  10/25/17: 64    Time  8    Period  Weeks    Status  New            Plan - 11/11/17 1033    Clinical Impression Statement  Pt is continuing to report sx relief following ESTIM and manual techniques. PT educated patient on sciatic nerve glide, to complete when foot numbess/tingling sx occur, to assess wheather sciatic nerve is the culprit of neural sx (as she is reporting sx relief from L4-L3 mob right away).     Rehab Potential  Good    Clinical Impairments Affecting Rehab Potential  (+) motivation to pursue more active lifestyle, fairly young age, social support (-) scheduling    PT Frequency  2x / week    PT Duration  8 weeks    PT Treatment/Interventions  Neuromuscular re-education;Passive range of motion;Manual techniques;Dry needling;Patient/family education;Therapeutic activities;Balance training;Therapeutic exercise;Electrical Stimulation;Cryotherapy;Ultrasound;Traction;Moist Heat;Functional mobility training;Aquatic Therapy    PT Next Visit Plan  post hip strengthening/hip mobs and manual for soft tissue restrictions    PT Home Exercise Plan  half kneeling stretch, glute stretch, bridge, hamstring  stretch    Consulted and Agree with Plan of Care  Patient       Patient will benefit from skilled therapeutic intervention in order to improve the following deficits and impairments:  Abnormal gait, Increased fascial restricitons, Impaired sensation, Improper body mechanics, Pain, Impaired tone, Postural dysfunction, Increased muscle spasms, Decreased mobility, Decreased activity tolerance, Decreased range of motion, Decreased strength, Difficulty  walking, Impaired flexibility  Visit Diagnosis: Pain in left hip     Problem List Patient Active Problem List   Diagnosis Date Noted  . Abnormal mammogram of right breast 10/27/2016  . SK (solar keratosis) 10/27/2016  . Status post device closure of ASD- with [phone] 10/27/2016  . Chronic sinusitis-  10/27/2016  . Family history of coronary artery disease in brother 10/27/2016  . 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 Family history of malignant melanoma of skin- sis around age 16ish 10/27/2016  . Obesity (BMI 30-39.9) 10/27/2016   Kathleen Bowen PT, DPT Kathleen Bowen 11/11/2017, 10:35 AM  [place] [facility] [address], [place], [address] Phone: [phone]   Fax:  [phone]  Name: Kathleen Bowen MRN: [id number] Date of Birth: 1971-02-27

## 2017-11-12 ENCOUNTER — Encounter: Payer: Self-pay | Admitting: Physical Therapy

## 2017-11-12 ENCOUNTER — Ambulatory Visit: Payer: BLUE CROSS/BLUE SHIELD | Admitting: Physical Therapy

## 2017-11-12 DIAGNOSIS — M25552 Pain in left hip: Secondary | ICD-10-CM

## 2017-11-12 DIAGNOSIS — M545 Low back pain, unspecified: Secondary | ICD-10-CM

## 2017-11-12 DIAGNOSIS — G8929 Other chronic pain: Secondary | ICD-10-CM

## 2017-11-12 NOTE — Therapy (Signed)
[place] [facility] [address], [place], [address] Phone: [phone]   Fax:  [phone]  Physical Therapy Treatment  Patient Details  Name: Kathleen Bowen MRN: [id number] Date of Birth: 02-15-1971 Referring Provider: Raliegh Scarlet MD   Encounter Date: 11/12/2017  PT End of Session - 11/12/17 1622    Visit Number  6    Number of Visits  17    Date for PT Re-Evaluation  12/18/17    PT Start Time  0415    PT Stop Time  0500    PT Time Calculation (min)  45 min    Activity Tolerance  Patient tolerated treatment well    Behavior During Therapy  [facility] for tasks assessed/performed       Past Medical History:  Diagnosis Date  . Heart disease   . Heart murmur   . Hx of PTL (preterm labor), current pregnancy     Past Surgical History:  Procedure Laterality Date  . ASD REPAIR  1992  . BREAST BIOPSY Right 05/14/2015   Benign  . HEMORROIDECTOMY    . MOHS SURGERY     PRECANCEROUS SKIN REMOVED  . TONSILLECTOMY      There were no vitals filed for this visit.  Subjective Assessment - 11/12/17 1618    Subjective  Patient reports no foot numbess since last visit. Patient reports minimal pain as well in the L hip/low back. Patient reports compliance with HEP with no questions or concerns.     Pertinent History  Patient is a 47 year old female presenting with bilat hip soreness over the past "couple months". Patient reports she is walking 3x/week 20-19mn at a time. Patient reports over the past couple weeks she has realized some foot numbness after walking 20 mins that she reports is not painful, just uncomfortable L>R. Patient denies electical, burning sensation, or posterior "shooting" down the LE's, just that the bottoms of her feet go numb. Patient reports she was on a predinsone taper and that helped with the bottom of the feet numbess, and now that she is off of it she is experiencing this bottom of the feet numbess  (which she describes as her feet are asleep ) all the time L>R. Patient reports pain in bilat hips when rising from the floor, that goes away after she stands. Worst pain in the past week 2/10 and best 0/10. Pt denies N/V, unexplained weight fluctuation, saddle paresthesia, fever, B&B changes, night sweats, or unrelenting night pain at this time.    Limitations  Sitting;Walking;Lifting    How long can you sit comfortably?  10-15 min    How long can you stand comfortably?  257m    How long can you walk comfortably?  2073m   Diagnostic tests  None at this time    Patient Stated Goals  Walking without feet tingling, stand from floor without pain    Pain Onset  More than a month ago       Ther-Ex - Squat with red band at knees for abd resistance 3x 10 with demo and cuing for proper form initially with good carry over following.  - Posterior pelvic tilts 3x 20 with demo and max cuing initially to properly contract core. PT utilized demo, VC and TC under patient's LB with good carry over following learning process - TA marches 3x 10 (tap each LE) with demo and cuing at first, with good carry over in sets following  -  Supine oblique twists 2x 10 (each side) with max cuing initially with good carry over.     ESTIM+ heat packHiVolt ESTIM72mn at patient tolerated160Vincreased to165V through treatmentat bilatglute/piriformisarea. Attemptedto decrease muscle tension in this area. With PT assessing patient tolerance throughout (increasing intensity as needed), monitoring skin integrity (normal), with decreased pain noted from patient.                    PT Education - 11/12/17 1620    Education Details  Exercise form     Person(s) Educated  Patient    Methods  Explanation;Demonstration;Verbal cues    Comprehension  Verbalized understanding;Returned demonstration;Verbal cues required       PT Short Term Goals - 10/23/17 1214      PT SHORT TERM GOAL #1   Title  Pt will  be independent with HEP in order to improve strength and flexibility in order to improve function at home and work.    Time  4    Period  Weeks    Status  New        PT Long Term Goals - 10/29/17 1604      PT LONG TERM GOAL #1   Title  Pt will increase hip ext strength by at least 1/2 MMT grade in order to demonstrate improvement in strength and function    Baseline  10/25/17 4/5    Time  8    Period  Weeks    Status  New      PT LONG TERM GOAL #2   Title  Patient will report no pain with walking for exercise to return to PLOF    Baseline  10/25/17: Patient reports 2/10 pain and bilat foot "tingling" with walking for exercise    Time  8    Period  Weeks    Status  New      PT LONG TERM GOAL #3   Title  Patient will increase FOTO score to 76 to demonstrate predicted increase in functional mobility to complete ADLs    Baseline  10/25/17: 64    Time  8    Period  Weeks    Status  New            Plan - 11/12/17 1650    Clinical Impression Statement  PT is able to increase therex progression as patient is reporting decreased pain today. Patient demonstrates difficulty with core activation, but is able to complete with PT cuing and demonstration. Patient with noted fatigue between exercise sets. Patient reports 0/10 pain following ESTIM at the end of session.     Rehab Potential  Good    Clinical Impairments Affecting Rehab Potential  (+) motivation to pursue more active lifestyle, fairly young age, social support (-) scheduling    PT Frequency  2x / week    PT Duration  8 weeks    PT Treatment/Interventions  Neuromuscular re-education;Passive range of motion;Manual techniques;Dry needling;Patient/family education;Therapeutic activities;Balance training;Therapeutic exercise;Electrical Stimulation;Cryotherapy;Ultrasound;Traction;Moist Heat;Functional mobility training;Aquatic Therapy    PT Next Visit Plan  post hip strengthening/hip mobs and manual for soft tissue restrictions     PT Home Exercise Plan  half kneeling stretch, glute stretch, bridge, hamstring stretch    Consulted and Agree with Plan of Care  Patient       Patient will benefit from skilled therapeutic intervention in order to improve the following deficits and impairments:  Abnormal gait, Increased fascial restricitons, Impaired sensation, Improper body mechanics, Pain, Impaired tone, Postural dysfunction,  Increased muscle spasms, Decreased mobility, Decreased activity tolerance, Decreased range of motion, Decreased strength, Difficulty walking, Impaired flexibility  Visit Diagnosis: Pain in left hip  Chronic left-sided low back pain without sciatica     Problem List Patient Active Problem List   Diagnosis Date Noted  . Abnormal mammogram of right breast 10/27/2016  . SK (solar keratosis) 10/27/2016  . Status post device closure of ASD- with [phone] 10/27/2016  . Chronic sinusitis-  10/27/2016  . Family history of coronary artery disease in brother 10/27/2016  . 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 Family history of malignant melanoma of skin- sis around age 29ish 10/27/2016  . Obesity (BMI 30-39.9) 10/27/2016   Shelton Silvas PT, DPT  Shelton Silvas 11/12/2017, 4:54 PM  [place] [facility] [address] [place], [address] Phone: [phone]   Fax:  [phone]  Name: VENERA PRIVOTT MRN: [id number] Date of Birth: Jul 21, 1970

## 2017-11-16 ENCOUNTER — Ambulatory Visit: Payer: BLUE CROSS/BLUE SHIELD | Admitting: Physical Therapy

## 2017-11-16 ENCOUNTER — Encounter: Payer: Self-pay | Admitting: Physical Therapy

## 2017-11-16 DIAGNOSIS — M545 Low back pain: Secondary | ICD-10-CM

## 2017-11-16 DIAGNOSIS — G8929 Other chronic pain: Secondary | ICD-10-CM

## 2017-11-16 DIAGNOSIS — M25552 Pain in left hip: Secondary | ICD-10-CM | POA: Diagnosis not present

## 2017-11-16 NOTE — Therapy (Signed)
[facility] [address] [place], [address] Phone: [phone]   Fax:  [phone]  Physical Therapy Treatment  Patient Details  Name: Kathleen Bowen MRN: [id number] Date of Birth: 12/30/1970 Referring Provider: Raliegh Scarlet MD   Encounter Date: 11/16/2017  PT End of Session - 11/16/17 1628    Visit Number  7    Number of Visits  17    Date for PT Re-Evaluation  12/18/17    PT Start Time  0415    PT Stop Time  0500    PT Time Calculation (min)  45 min    Activity Tolerance  Patient tolerated treatment well    Behavior During Therapy  [facility] for tasks assessed/performed       Past Medical History:  Diagnosis Date  . Heart disease   . Heart murmur   . Hx of PTL (preterm labor), current pregnancy     Past Surgical History:  Procedure Laterality Date  . ASD REPAIR  1992  . BREAST BIOPSY Right 05/14/2015   Benign  . HEMORROIDECTOMY    . MOHS SURGERY     PRECANCEROUS SKIN REMOVED  . TONSILLECTOMY      There were no vitals filed for this visit.  Subjective Assessment - 11/16/17 1620    Subjective  Patient reports minimal LB or hip pain, but L foot numbness that began today after prolonged walking at work. Patient reports she attempted sciatic nerve glide did not help. Patient reports compliance with HEP with no questions or concerns.     Pertinent History  Patient is a 47 year old female presenting with bilat hip soreness over the past "couple months". Patient reports she is walking 3x/week 20-37mn at a time. Patient reports over the past couple weeks she has realized some foot numbness after walking 20 mins that she reports is not painful, just uncomfortable L>R. Patient denies electical, burning sensation, or posterior "shooting" down the LE's, just that the bottoms of her feet go numb. Patient reports she was on a predinsone taper and that helped with the bottom of the feet numbess, and now that she is off  of it she is experiencing this bottom of the feet numbess (which she describes as her feet are asleep ) all the time L>R. Patient reports pain in bilat hips when rising from the floor, that goes away after she stands. Worst pain in the past week 2/10 and best 0/10. Pt denies N/V, unexplained weight fluctuation, saddle paresthesia, fever, B&B changes, night sweats, or unrelenting night pain at this time.    Limitations  Sitting;Walking;Lifting    How long can you sit comfortably?  10-15 min    How long can you stand comfortably?  280m    How long can you walk comfortably?  2045m   Diagnostic tests  None at this time    Patient Stated Goals  Walking without feet tingling, stand from floor without pain    Pain Onset  More than a month ago         Ther-Ex - Seated sciatic nerve glide   -Squat with red band at knees for abd resistance 3x 10 with mat table as TC at lowest level - Posterior pelvic tilts x 20 with 50% carry over from last session, with proper form following PT demo - TA marches 3x 10 (tap each LE) with 50% carry over from last session with accuracy following PT demo and cuing - Supine  lower trunk rotations with PT providing manual resistance for oblique activation 3x 10 (each side) with max cuing initially with good carry over.  - Deadbug LE only x6 (each LE) with demo and cuing to prevent from low back arch initially with good carry over following; 2x 6 with addition of UE movement    Manual - Supine sciatic nerve glide with PT supporting LE at 90/90 increasing range with pt repeated PF/DF - STM withtrigger point releasetobilatglute med/max/piriformis (increased time spent here d/t increased trigger points/cheif c/o pain)  - L4- S3 grade I-II mobilization 30sec bouts 6 bouts each segment for LB pain reduction                     PT Education - 11/16/17 1623    Education Details  Exercise form    Person(s) Educated  Patient    Methods   Explanation;Demonstration;Verbal cues    Comprehension  Verbalized understanding;Returned demonstration;Verbal cues required       PT Short Term Goals - 10/23/17 1214      PT SHORT TERM GOAL #1   Title  Pt will be independent with HEP in order to improve strength and flexibility in order to improve function at home and work.    Time  4    Period  Weeks    Status  New        PT Long Term Goals - 10/29/17 1604      PT LONG TERM GOAL #1   Title  Pt will increase hip ext strength by at least 1/2 MMT grade in order to demonstrate improvement in strength and function    Baseline  10/25/17 4/5    Time  8    Period  Weeks    Status  New      PT LONG TERM GOAL #2   Title  Patient will report no pain with walking for exercise to return to PLOF    Baseline  10/25/17: Patient reports 2/10 pain and bilat foot "tingling" with walking for exercise    Time  8    Period  Weeks    Status  New      PT LONG TERM GOAL #3   Title  Patient will increase FOTO score to 76 to demonstrate predicted increase in functional mobility to complete ADLs    Baseline  10/25/17: 64    Time  8    Period  Weeks    Status  New            Plan - 11/16/17 1652    Clinical Impression Statement  PT is able to relieve foot numbess with combination of manual with sciatic glides to empower patient to resolve this on her own with proper glide form. PT continued to progress therex with good carry over from previous session of form/activation, needing min cuing and demo to complete with accuracy. PT continued to encourage self gliding/stretching for relief from numbness/tingling sx, with core strengthening as "long term" solution.    Rehab Potential  Good    Clinical Impairments Affecting Rehab Potential  (+) motivation to pursue more active lifestyle, fairly young age, social support (-) scheduling    PT Frequency  2x / week    PT Duration  8 weeks    PT Treatment/Interventions  Neuromuscular re-education;Passive  range of motion;Manual techniques;Dry needling;Patient/family education;Therapeutic activities;Balance training;Therapeutic exercise;Electrical Stimulation;Cryotherapy;Ultrasound;Traction;Moist Heat;Functional mobility training;Aquatic Therapy    PT Next Visit Plan  post hip strengthening/hip mobs and manual  for soft tissue restrictions    PT Home Exercise Plan  half kneeling stretch, glute stretch, bridge, hamstring stretch    Consulted and Agree with Plan of Care  Patient       Patient will benefit from skilled therapeutic intervention in order to improve the following deficits and impairments:  Abnormal gait, Increased fascial restricitons, Impaired sensation, Improper body mechanics, Pain, Impaired tone, Postural dysfunction, Increased muscle spasms, Decreased mobility, Decreased activity tolerance, Decreased range of motion, Decreased strength, Difficulty walking, Impaired flexibility  Visit Diagnosis: Pain in left hip  Chronic left-sided low back pain without sciatica     Problem List Patient Active Problem List   Diagnosis Date Noted  . Abnormal mammogram of right breast 10/27/2016  . SK (solar keratosis) 10/27/2016  . Status post device closure of ASD- with [phone] 10/27/2016  . Chronic sinusitis-  10/27/2016  . Family history of coronary artery disease in brother 10/27/2016  . 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 Family history of malignant melanoma of skin- sis around age 30ish 10/27/2016  . Obesity (BMI 30-39.9) 10/27/2016   Shelton Silvas PT, DPT Shelton Silvas 11/16/2017, 4:59 PM  [facility] [address], [place], [address] Phone: [phone]   Fax:  [phone]  Name: Kathleen Bowen MRN: [id number] Date of Birth: Apr 12, 1970

## 2017-11-19 IMAGING — CT CT PARANASAL SINUSES LIMITED
1 series · 15 of 17 positions shown, 19 images · non-contrast
Comparison: Sinonasal radiographs November 02, 2015

CLINICAL DATA: Sinusitis for 5 weeks, 2 rounds of antibiotics.
Feels fluid in ears. Evaluate acute recurrent sinusitis.

EXAM:
CT PARANASAL SINUS LIMITED WITHOUT CONTRAST
TECHNIQUE: Non-contiguous multidetector CT images of the paranasal sinuses were
obtained in a single plane without contrast.

[Series 3: limited sinus st · axial · 0.35mm/px · z∈[-292,-152]mm · 15 of 17 slices shown, 19 images]
[im 2/17  brain]
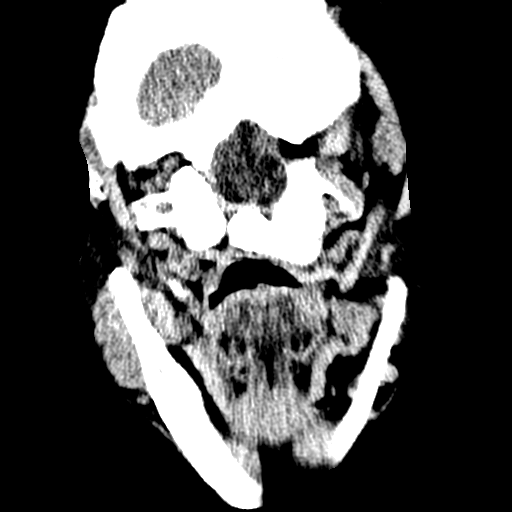
[im 2/17  bone]
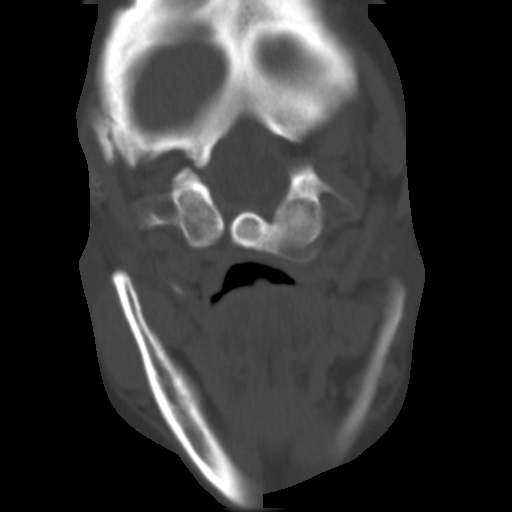
[im 3/17  bone]
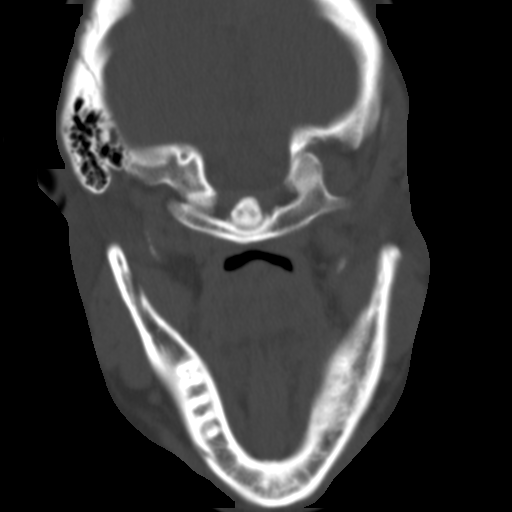
[im 4/17  bone]
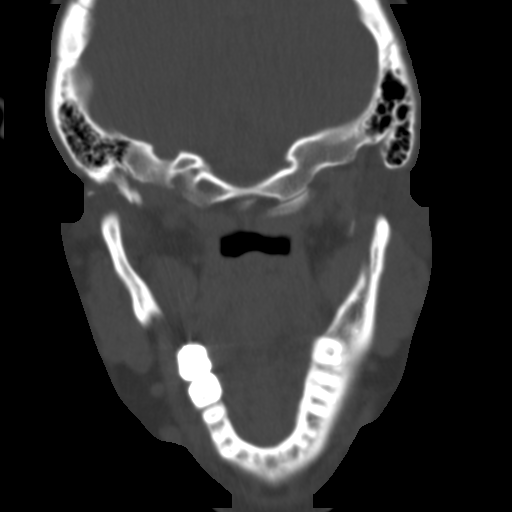
[im 5/17  bone]
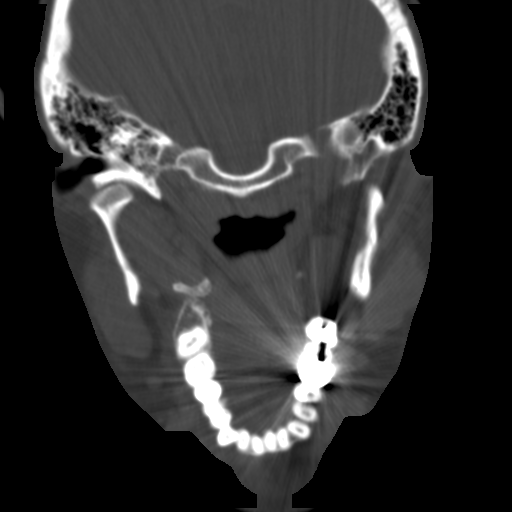
[im 6/17  brain]
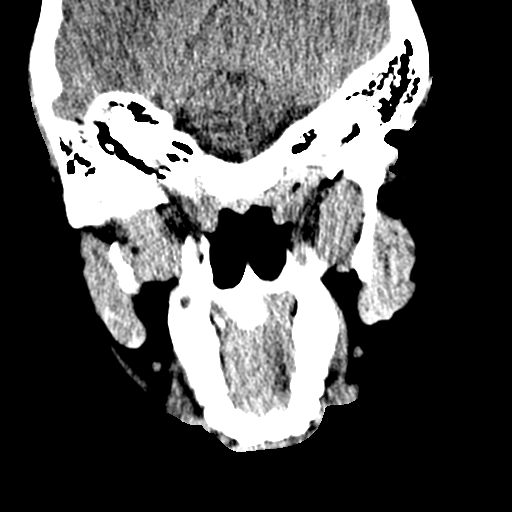
[im 6/17  bone]
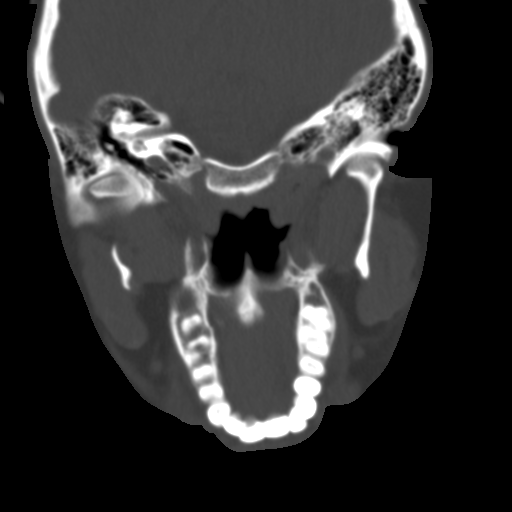
[im 7/17  bone]
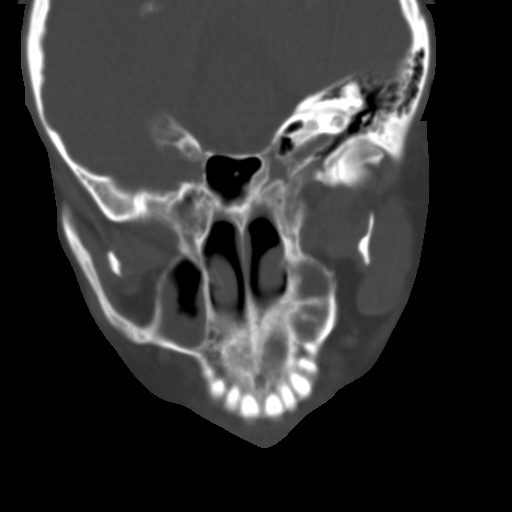
[im 8/17  bone]
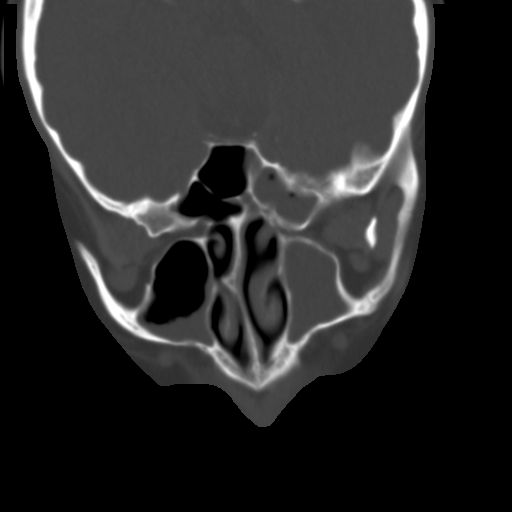
[im 9/17  bone]
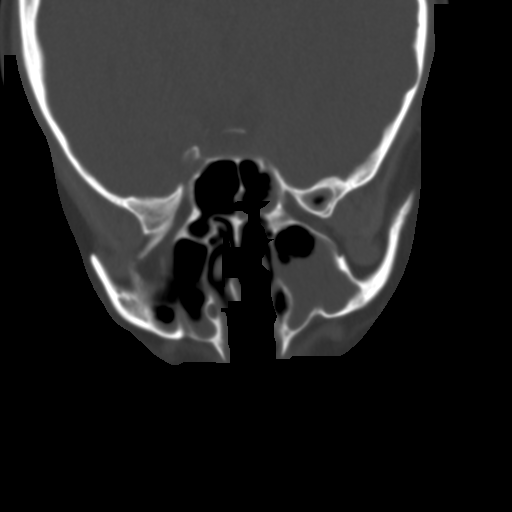
[im 10/17  brain]
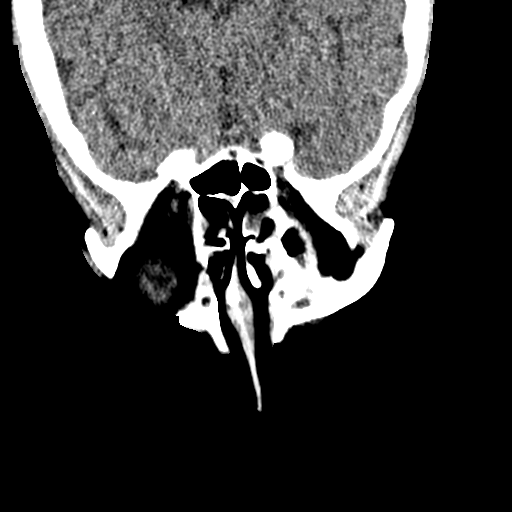
[im 10/17  bone]
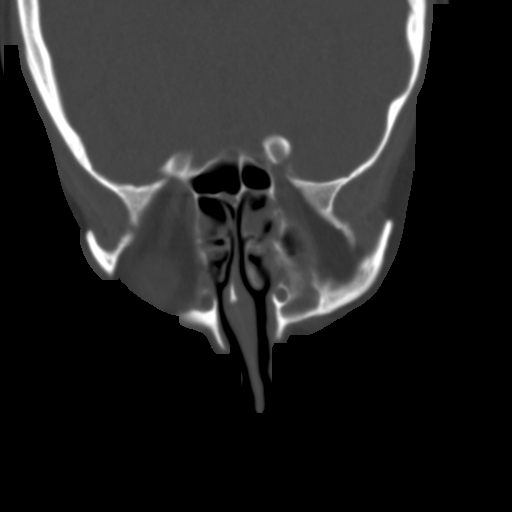
[im 11/17  bone]
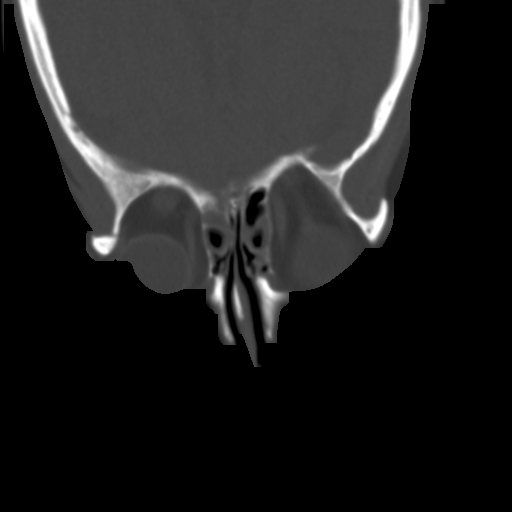
[im 12/17  bone]
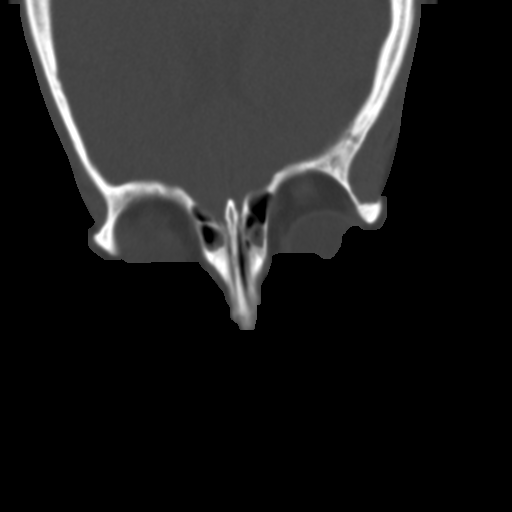
[im 13/17  bone]
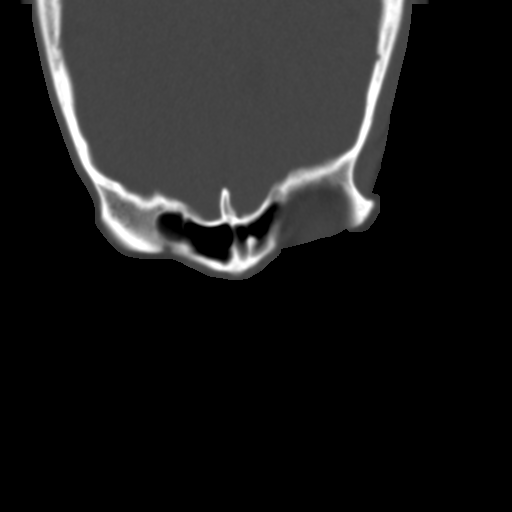
[im 14/17  brain]
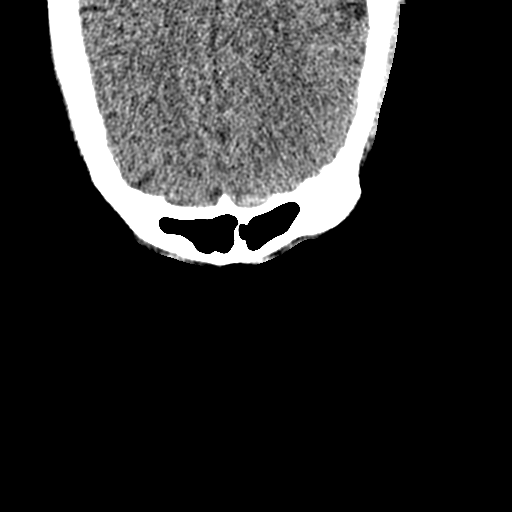
[im 14/17  bone]
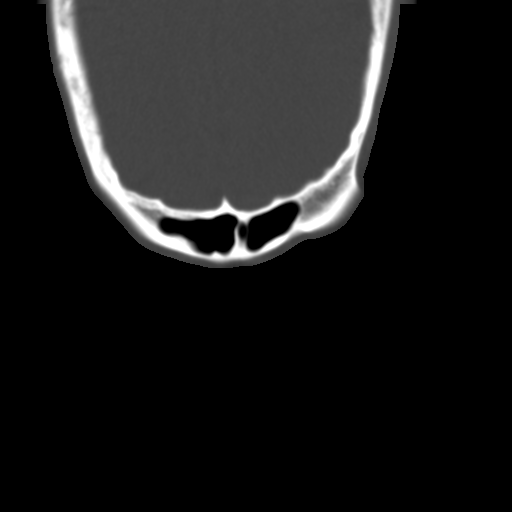
[im 15/17  bone]
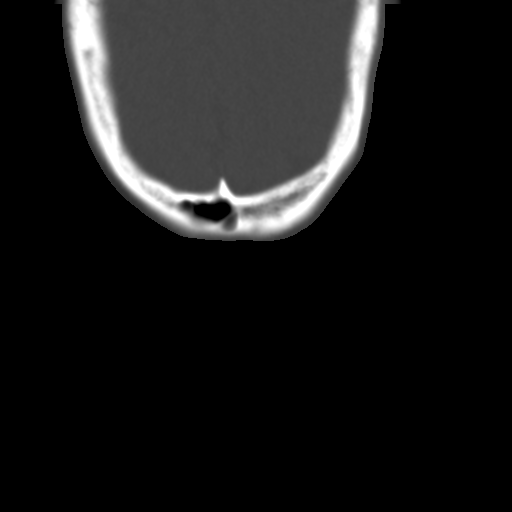
[im 16/17  bone]
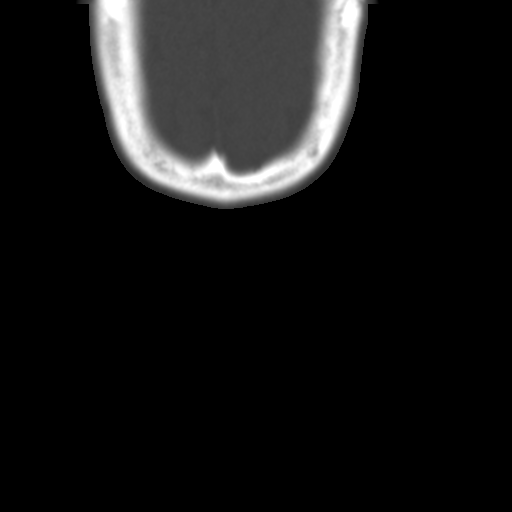

[15 of 17 positions shown; findings below may reference images not displayed]

FINDINGS: SINUSES: RIGHT greater than LEFT maxillary sinus air-fluid levels
and frothy secretions. Moderate RIGHT sphenoid mucosal thickening
and frothy secretions. Minimal frothy secretions LEFT sphenoid
sinus. Moderate ethmoid mucosal thickening. Frothy secretions RIGHT
frontal sinus. Imaged mastoid air cells are well aerated. No
definite mucoperiosteal reaction. Nasal septum mildly deviated to
the LEFT.

FACIAL BONES: No acute facial fracture. No destructive bony lesions.

ORBITS: Ocular globes and orbital contents are unremarkable.
IMPRESSION: Pan paranasal acute sinusitis.

## 2017-11-23 ENCOUNTER — Encounter: Payer: Self-pay | Admitting: Physical Therapy

## 2017-11-23 ENCOUNTER — Ambulatory Visit: Payer: BLUE CROSS/BLUE SHIELD | Admitting: Physical Therapy

## 2017-11-23 DIAGNOSIS — M25552 Pain in left hip: Secondary | ICD-10-CM

## 2017-11-23 NOTE — Therapy (Signed)
[facility] [address] [place], [address] Phone: [phone]   Fax:  [phone]  Physical Therapy Treatment  Patient Details  Name: Kathleen Bowen MRN: [id number] Date of Birth: 12/30/1970 Referring Provider: Raliegh Scarlet MD   Encounter Date: 11/23/2017  PT End of Session - 11/23/17 2129    Visit Number  8    Number of Visits  17    Date for PT Re-Evaluation  12/18/17    PT Start Time  0415    PT Stop Time  0445    PT Time Calculation (min)  30 min    Activity Tolerance  Patient tolerated treatment well    Behavior During Therapy  [facility] for tasks assessed/performed       Past Medical History:  Diagnosis Date  . Heart disease   . Heart murmur   . Hx of PTL (preterm labor), current pregnancy     Past Surgical History:  Procedure Laterality Date  . ASD REPAIR  1992  . BREAST BIOPSY Right 05/14/2015   Benign  . HEMORROIDECTOMY    . MOHS SURGERY     PRECANCEROUS SKIN REMOVED  . TONSILLECTOMY      There were no vitals filed for this visit.  Subjective Assessment - 11/23/17 1622    Subjective  Patient reports no pain or numbness since last visit and reports she thinks she is ready to d/c PT at this point, and has a good understanding of her HEP to do so.     Pertinent History  Patient is a 47 year old female presenting with bilat hip soreness over the past "couple months". Patient reports she is walking 3x/week 20-70mn at a time. Patient reports over the past couple weeks she has realized some foot numbness after walking 20 mins that she reports is not painful, just uncomfortable L>R. Patient denies electical, burning sensation, or posterior "shooting" down the LE's, just that the bottoms of her feet go numb. Patient reports she was on a predinsone taper and that helped with the bottom of the feet numbess, and now that she is off of it she is experiencing this bottom of the feet numbess (which she  describes as her feet are asleep ) all the time L>R. Patient reports pain in bilat hips when rising from the floor, that goes away after she stands. Worst pain in the past week 2/10 and best 0/10. Pt denies N/V, unexplained weight fluctuation, saddle paresthesia, fever, B&B changes, night sweats, or unrelenting night pain at this time.    Limitations  Sitting;Walking;Lifting    How long can you sit comfortably?  10-15 min    How long can you stand comfortably?  229m    How long can you walk comfortably?  2019m   Diagnostic tests  None at this time    Patient Stated Goals  Walking without feet tingling, stand from floor without pain    Pain Onset  More than a month ago         Ther-Ex HEP review of the following exercises with education on strengthening parameters (3-5 sets; 5-10 reps; 2x/week) -Squat with red band at knees for abd resistance x10 with mat table as TC at lowest level - Standing hip abd red tband x10 with min cuing for eccentric control - Standing hip abd red tband x10 with min cuing for proper form without forward trunk lean - Visual review of current HEP - Verbalized review and understanding  of pain management techniques should pain arise (self massage, heat application, sciatic nerve glide)   Manual - STM withtrigger point releasetobilatglute med/max/piriformis (increased time spent here d/t increased trigger points/cheif c/o pain)  - Following, manual piriformis stretch 4x 30sec hold supine                     PT Education - 11/23/17 1623    Education Details  Exercise form, DC reccommendations, HEP review    Person(s) Educated  Patient    Methods  Explanation;Demonstration;Verbal cues;Handout    Comprehension  Verbalized understanding;Returned demonstration;Verbal cues required       PT Short Term Goals - 10/23/17 1214      PT SHORT TERM GOAL #1   Title  Pt will be independent with HEP in order to improve strength and flexibility in order  to improve function at home and work.    Time  4    Period  Weeks    Status  New        PT Long Term Goals - 11/23/17 1623      PT LONG TERM GOAL #1   Title  Pt will increase hip ext strength by at least 1/2 MMT grade in order to demonstrate improvement in strength and function    Baseline  11/23/17: 5/5 gross with L abd 4+/5    Time  8    Period  Weeks    Status  Achieved      PT LONG TERM GOAL #2   Title  Patient will report no pain with walking for exercise to return to PLOF    Baseline  11/23/17 No pain with walking over past week    Time  8    Period  Weeks    Status  Achieved      PT LONG TERM GOAL #3   Title  Patient will increase FOTO score to 76 to demonstrate predicted increase in functional mobility to complete ADLs    Baseline  11/23/17 100    Time  8    Period  Weeks    Status  Achieved            Plan - 11/23/17 2130    Clinical Impression Statement  Patient reports she feels as though she can DC PT and is happy with her progress. PT reviewed HEP with new additions to maintain hip strength gains made through PT. PT educated patient on pain management as well, which patient verbalized understanding of. Patient has met all goals at this time, and is able to demonstrate HEP with accuracy. PT will DC patient at this time with clinic contract info should any further questions arise.     Rehab Potential  Good    Clinical Impairments Affecting Rehab Potential  (+) motivation to pursue more active lifestyle, fairly young age, social support (-) scheduling    PT Frequency  2x / week    PT Duration  8 weeks    PT Treatment/Interventions  Neuromuscular re-education;Passive range of motion;Manual techniques;Dry needling;Patient/family education;Therapeutic activities;Balance training;Therapeutic exercise;Electrical Stimulation;Cryotherapy;Ultrasound;Traction;Moist Heat;Functional mobility training;Aquatic Therapy    PT Next Visit Plan  post hip strengthening/hip mobs and  manual for soft tissue restrictions    PT Home Exercise Plan  half kneeling stretch, glute stretch, bridge, hamstring stretch    Consulted and Agree with Plan of Care  Patient       Patient will benefit from skilled therapeutic intervention in order to improve the following deficits and  impairments:  Abnormal gait, Increased fascial restricitons, Impaired sensation, Improper body mechanics, Pain, Impaired tone, Postural dysfunction, Increased muscle spasms, Decreased mobility, Decreased activity tolerance, Decreased range of motion, Decreased strength, Difficulty walking, Impaired flexibility  Visit Diagnosis: Pain in left hip     Problem List Patient Active Problem List   Diagnosis Date Noted  . Abnormal mammogram of right breast 10/27/2016  . SK (solar keratosis) 10/27/2016  . Status post device closure of ASD- with [phone] 10/27/2016  . Chronic sinusitis-  10/27/2016  . Family history of coronary artery disease in brother 10/27/2016  . 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 Family history of malignant melanoma of skin- sis around age 39ish 10/27/2016  . Obesity (BMI 30-39.9) 10/27/2016   Shelton Silvas PT, DPT Shelton Silvas 11/23/2017, 9:33 PM  [facility] [address] [place], [address] Phone: [phone]   Fax:  [phone]  Name: Kathleen Bowen MRN: [id number] Date of Birth: [birth year]

## 2017-11-30 ENCOUNTER — Encounter: Payer: BLUE CROSS/BLUE SHIELD | Admitting: Physical Therapy

## 2017-11-30 ENCOUNTER — Ambulatory Visit
Admission: RE | Admit: 2017-11-30 | Discharge: 2017-11-30 | Disposition: A | Discharge status: Home / Self Care | Payer: BLUE CROSS/BLUE SHIELD | Source: Ambulatory Visit | Attending: Obstetrics | Admitting: Obstetrics

## 2017-11-30 DIAGNOSIS — R921 Mammographic calcification found on diagnostic imaging of breast: Secondary | ICD-10-CM

## 2017-12-01 ENCOUNTER — Ambulatory Visit: Payer: BLUE CROSS/BLUE SHIELD | Admitting: Physical Therapy

## 2017-12-07 ENCOUNTER — Encounter: Payer: BLUE CROSS/BLUE SHIELD | Admitting: Physical Therapy

## 2018-01-05 ENCOUNTER — Encounter: Payer: Self-pay | Admitting: Family Medicine

## 2018-01-05 ENCOUNTER — Ambulatory Visit (INDEPENDENT_AMBULATORY_CARE_PROVIDER_SITE_OTHER): Payer: BLUE CROSS/BLUE SHIELD | Admitting: Family Medicine

## 2018-01-05 VITALS — BP 125/74 | HR 67 | Ht 68.0 in | Wt 193.0 lb

## 2018-01-05 DIAGNOSIS — E663 Overweight: Secondary | ICD-10-CM | POA: Diagnosis not present

## 2018-01-05 NOTE — Patient Instructions (Signed)
Please check all your foods all day long.  This is extremely important toward your awareness of what your good habits are in bad habits.  Also remember that weight loss is 90% what you eat.  I will see you mid January and let me know if you have any questions prior   Behavior Modification Ideas for Weight Management  Weight management involves adopting a healthy lifestyle that includes a knowledge of nutrition and exercise, a positive attitude and the right kind of motivation. Internal motives such as better health, increased energy, self-esteem and personal control increase your chances of lifelong weight management success.  Remember to have realistic goals and think long-term success. Believe in yourself and you can do it. The following information will give you ideas to help you meet your goals.  Control Your Home Environment  Eat only while sitting down at the kitchen or dining room table. Do not eat while watching television, reading, cooking, talking on the phone, standing at the refrigerator or working on the computer. Keep tempting foods out of the house - don't buy them. Keep tempting foods out of sight. Have low-calorie foods ready to eat. Unless you are preparing a meal, stay out of the kitchen. Have healthy snacks at your disposal, such as small pieces of fruit, vegetables, canned fruit, pretzels, low-fat string cheese and nonfat cottage cheese.  Control Your Work Environment  Do not eat at [employer] or keep tempting snacks at your desk. If you get hungry between meals, plan healthy snacks and bring them with you to work. During your breaks, go for a walk instead of eating. If you work around food, plan in advance the one item you will eat at mealtime. Make it inconvenient to nibble on food by chewing gum, sugarless candy or drinking water or another low-calorie beverage. Do not work through meals. Skipping meals slows down metabolism and may result in overeating at the next  meal. If food is available for special occasions, either pick the healthiest item, nibble on low-fat snacks brought from home, don't have anything offered, choose one option and have a small amount, or have only a beverage.  Control Your Mealtime Environment  Serve your plate of food at the stove or kitchen counter. Do not put the serving dishes on the table. If you do put dishes on the table, remove them immediately when finished eating. Fill half of your plate with vegetables, a quarter with lean protein and a quarter with starch. Use smaller plates, bowls and glasses. A smaller portion will look large when it is in a little dish. Politely refuse second helpings. When fixing your plate, limit portions of food to one scoop/serving or less.   Daily Food Management  Replace eating with another activity that you will not associate with food. Wait 20 minutes before eating something you are craving. Drink a large glass of water or diet soda before eating. Always have a big glass or bottle of water to drink throughout the day. Avoid high-calorie add-ons such as cream with your coffee, butter, mayonnaise and salad dressings.  Shopping: Do not shop when hungry or tired. Shop from a list and avoid buying anything that is not on your list. If you must have tempting foods, buy individual-sized packages and try to find a lower-calorie alternative. Don't taste test in the store. Read food labels. Compare products to help you make the healthiest choices.  Preparation: Chew a piece of gum while cooking meals. Use a quarter teaspoon if  you taste test your food. Try to only fix what you are going to eat, leaving yourself no chance for seconds. If you have prepared more food than you need, portion it into individual containers and freeze or refrigerate immediately. Don't snack while cooking meals.  Eating: Eat slowly. Remember it takes about 20 minutes for your stomach to send a message to your  brain that it is full. Don't let fake hunger make you think you need more. The ideal way to eat is to take a bite, put your utensil down, take a sip of water, cut your next bite, take a bit, put your utensil down and so on. Do not cut your food all at one time. Cut only as needed. Take small bites and chew your food well. Stop eating for a minute or two at least once during a meal or snack. Take breaks to reflect and have conversation.  Cleanup and Leftovers: Label leftovers for a specific meal or snack. Freeze or refrigerate individual portions of leftovers. Do not clean up if you are still hungry.  Eating Out and Social Eating  Do not arrive hungry. Eat something light before the meal. Try to fill up on low-calorie foods, such as vegetables and fruit, and eat smaller portions of the high-calorie foods. Eat foods that you like, but choose small portions. If you want seconds, wait at least 20 minutes after you have eaten to see if you are actually hungry or if your eyes are bigger than your stomach. Limit alcoholic beverages. Try a soda water with a twist of lime. Do not skip other meals in the day to save room for the special event.  At Restaurants: Order  la carte rather than buffet style. Order some vegetables or a salad for an appetizer instead of eating bread. If you order a high-calorie dish, share it with someone. Try an after-dinner mint with your coffee. If you do have dessert, share it with two or more people. Don't overeat because you do not want to waste food. Ask for a doggie bag to take extra food home. Tell the server to put half of your entree in a to go bag before the meal is served to you. Ask for salad dressing, gravy or high-fat sauces on the side. Dip the tip of your fork in the dressing before each bite. If bread is served, ask for only one piece. Try it plain without butter or oil. At [employer] where oil and vinegar is served with bread, use only a small  amount of oil and a lot of vinegar for dipping.  At a [facility]: Offer to bring a dish, appetizer or dessert that is low in calories. Serve yourself small portions or tell the host that you only want a small amount. Stand or sit away from the snack table. Stay away from the kitchen or stay busy if you are near the food. Limit your alcohol intake.  At [employer] and Cafeterias: Cover most of your plate with lettuce and/or vegetables. Use a salad plate instead of a dinner plate. After eating, clear away your dishes before having coffee or tea.  Entertaining at Home: Explore low-fat, low-cholesterol cookbooks. Use single-serving foods like chicken breasts or hamburger patties. Prepare low-calorie appetizers and desserts.   Holidays: Keep tempting foods out of sight. Decorate the house without using food. Have low-calorie beverages and foods on hand for guests. Allow yourself one planned treat a day. Don't skip meals to save up for the holiday  feast. Eat regular, planned meals.   Exercise Well  Make exercise a priority and a planned activity in the day. If possible, walk the entire or part of the distance to work. Get an exercise buddy. Go for a walk with a colleague during one of your breaks, go to the gym, run or take a walk with a friend, walk in the mall with a shopping companion. Park at the end of the parking lot and walk to the store or office entrance. Always take the stairs all of the way or at least part of the way to your floor. If you have a desk job, walk around the office frequently. Do leg lifts while sitting at your desk. Do something outside on the weekends like going for a hike or a bike ride.   Have a Healthy Attitude  Make health your weight management priority. Be realistic. Have a goal to achieve a healthier you, not necessarily the lowest weight or ideal weight based on calculations or tables. Focus on a healthy eating style, not on dieting. Dieting  usually lasts for a short amount of time and rarely produces long-term success. Think long term. You are developing new healthy behaviors to follow next month, in a year and in a decade.    This information is for educational purposes only and is not intended to replace the advice of your doctor or health care provider. We encourage you to discuss with your doctor any questions or concerns you may have.        Guidelines for Losing Weight   We want weight loss that will last so you should lose 1-2 pounds a week.  THAT IS IT! Please pick THREE things a month to change. Once it is a habit check off the item. Then pick another three items off the list to become habits.  If you are already doing a habit on the list GREAT!  Cross that item off!  Don't drink your calories. Ie, alcohol, soda, fruit juice, and sweet tea.   Drink more water. Drink a glass when you feel hungry or before each meal.   Eat breakfast - Complex carb and protein (likeDannon light and fit yogurt, oatmeal, fruit, eggs, [place] bacon).  Measure your cereal.  Eat no more than one cup a day. (ie Kashi)  Eat an apple a day.  Add a vegetable a day.  Try a new vegetable a month.  Use Pam! Stop using oil or butter to cook.  Don't finish your plate or use smaller plates.  Share your dessert.  Eat sugar free Jello for dessert or frozen grapes.  Don't eat 2-3 hours before bed.  Switch to whole wheat bread, pasta, and brown rice.  Make healthier choices when you eat out. No fries!  Pick baked chicken, NOT fried.  Don't forget to SLOW DOWN when you eat. It is not going anywhere.   Take the stairs.  Park far away in the parking lot  Lift soup cans (or weights) for 10 minutes while watching TV.  Walk at work for 10 minutes during break.  Walk outside 1 time a week with your friend, kids, dog, or significant other.  Start a walking group at church.  Walk the mall as much as you can tolerate.   Keep a  food diary.  Weigh yourself daily.  Walk for 15 minutes 3 days per week.  Cook at home more often and eat out less. If life happens and you go back to  old habits, it is okay.  Just start over. You can do it!  If you experience chest pain, get short of breath, or tired during the exercise, please stop immediately and inform your doctor.    Before you even begin to attack a weight-loss plan, it pays to remember this: You are not fat. You have fat. Losing weight isn't about blame or shame; it's simply another achievement to accomplish. Dieting is like any other skill-you have to buckle down and work at it. As long as you act in a smart, reasonable way, you'll ultimately get where you want to be. Here are some weight loss pearls for you.   1. It's Not a Diet. It's a Lifestyle Thinking of a diet as something you're on and suffering through only for the short term doesn't work. To shed weight and keep it off, you need to make permanent changes to the way you eat. It's OK to indulge occasionally, of course, but if you cut calories temporarily and then revert to your old way of eating, you'll gain back the weight quicker than you can say yo-yo. Use it to lose it. Research shows that one of the best predictors of long-term weight loss is how many pounds you drop in the first month. For that reason, nutritionists often suggest being stricter for the first two weeks of your new eating strategy to build momentum. Cut out added sugar and alcohol and avoid unrefined carbs. After that, figure out how you can reincorporate them in a way that's healthy and maintainable.  2. There's a Right Way to Exercise Working out burns calories and fat and boosts your metabolism by building muscle. But those trying to lose weight are notorious for overestimating the number of calories they burn and underestimating the amount they take in. Unfortunately, your system is biologically programmed to hold on to extra pounds and that  means when you start exercising, your body senses the deficit and ramps up its hunger signals. If you're not diligent, you'll eat everything you burn and then some. Use it, to lose it. Cardio gets all the exercise glory, but strength and interval training are the real heroes. They help you build lean muscle, which in turn increases your metabolism and calorie-burning ability 3. Don't Overreact to Mild Hunger Some people have a hard time losing weight because of hunger anxiety. To them, being hungry is bad-something to be avoided at all costs-so they carry snacks with them and eat when they don't need to. Others eat because they're stressed out or bored. While you never want to get to the point of being ravenous (that's when bingeing is likely to happen), a hunger pang, a craving, or the fact that it's 3:00 p.m. should not send you racing for the vending machine or obsessing about the energy bar in your purse. Ideally, you should put off eating until your stomach is growling and it's difficult to concentrate.  Use it to lose it. When you feel the urge to eat, use the HALT method. Ask yourself, Am I really hungry? Or am I angry or anxious, lonely or bored, or tired? If you're still not certain, try the apple test. If you're truly hungry, an apple should seem delicious; if it doesn't, something else is going on. Or you can try drinking water and making yourself busy, if you are still hungry try a healthy snack.  4. Not All Calories Are Created Equal The mechanics of weight loss are pretty simple: Take in fewer  calories than you use for energy. But the kind of food you eat makes all the difference. Processed food that's high in saturated fat and refined starch or sugar can cause inflammation that disrupts the hormone signals that tell your brain you're full. The result: You eat a lot more.  Use it to lose it. Clean up your diet. Swap in whole, unprocessed foods, including vegetables, lean protein, and healthy  fats that will fill you up and give you the biggest nutritional bang for your calorie buck. In a few weeks, as your brain starts receiving regular hunger and fullness signals once again, you'll notice that you feel less hungry overall and naturally start cutting back on the amount you eat.  5. Protein, Produce, and Plant-Based Fats Are Your Weight-Loss Trinity Here's why eating the three Ps regularly will help you drop pounds. Protein fills you up. You need it to build lean muscle, which keeps your metabolism humming so that you can torch more fat. People in a weight-loss program who ate double the recommended daily allowance for protein (about 110 grams for a 150-pound woman) lost 70 percent of their weight from fat, while people who ate the RDA lost only about 40 percent, one study found. Produce is packed with filling fiber. "It's very difficult to consume too many calories if you're eating a lot of vegetables. Example: Three cups of broccoli is a lot of food, yet only 93 calories. (Fruit is another story. It can be easy to overeat and can contain a lot of calories from sugar, so be sure to monitor your intake.) Plant-based fats like olive oil and those in avocados and nuts are healthy and extra satiating.  Use it to lose it. Aim to incorporate each of the three Ps into every meal and snack. People who eat protein throughout the day are able to keep weight off, according to a study in the [place] [facility] [employer]. In addition to meat, poultry and seafood, good sources are beans, lentils, eggs, tofu, and yogurt. As for fat, keep portion sizes in check by measuring out salad dressing, oil, and nut butters (shoot for one to two tablespoons). Finally, eat veggies or a little fruit at every meal. People who did that consumed 308 fewer calories but didn't feel any hungrier than when they didn't eat more produce.  7. How You Eat Is As Important As What You Eat In order for your brain to register  that you're full, you need to focus on what you're eating. Sit down whenever you eat, preferably at a table. Turn off the TV or computer, put down your phone, and look at your food. Smell it. Chew slowly, and don't put another bite on your fork until you swallow. When women ate lunch this attentively, they consumed 30 percent less when snacking later than those who listened to an audiobook at lunchtime, according to a study in the [place] of Nutrition. 8. Weighing Yourself Really Works The scale provides the best evidence about whether your efforts are paying off. Seeing the numbers tick up or down or stagnate is motivation to keep going-or to rethink your approach. A 2015 study at [facility] found that daily weigh-ins helped people lose more weight, keep it off, and maintain that loss, even after two years. Use it to lose it. Step on the scale at the same time every day for the best results. If your weight shoots up several pounds from one weigh-in to the next, don't freak  out. Eating a lot of salt the night before or having your period is the likely culprit. The number should return to normal in a day or two. It's a steady climb that you need to do something about. 9. Too Much Stress and Too Little Sleep Are Your Enemies When you're tired and frazzled, your body cranks up the production of cortisol, the stress hormone that can cause carb cravings. Not getting enough sleep also boosts your levels of ghrelin, a hormone associated with hunger, while suppressing leptin, a hormone that signals fullness and satiety. People on a diet who slept only five and a half hours a night for two weeks lost 55 percent less fat and were hungrier than those who slept eight and a half hours, according to a study in the [place]. Use it to lose it. Prioritize sleep, aiming for seven hours or more a night, which research shows helps lower stress. And make sure you're getting quality  zzz's. If a snoring spouse or a fidgety cat wakes you up frequently throughout the night, you may end up getting the equivalent of just four hours of sleep, according to a study from [facility]. Keep pets out of the bedroom, and use a white-noise app to drown out snoring. 10. You Will Hit a plateau-And You Can Bust Through It As you slim down, your body releases much less leptin, the fullness hormone.  If you're not strength training, start right now. Building muscle can raise your metabolism to help you overcome a plateau. To keep your body challenged and burning calories, incorporate new moves and more intense intervals into your workouts or add another sweat session to your weekly routine. Alternatively, cut an extra 100 calories or so a day from your diet. Now that you've lost weight, your body simply doesn't need as much fuel.    Since food equals calories, in order to lose weight you must either eat fewer calories, exercise more to burn off calories with activity, or both. Food that is not used to fuel the body is stored as fat. A major component of losing weight is to make smarter food choices. Here's how:  1)   Limit non-nutritious foods, such as: Sugar, honey, syrups and candy Pastries, donuts, pies, cakes and cookies Soft drinks, sweetened juices and alcoholic beverages  2)  Cut down on high-fat foods by: - Choosing poultry, fish or lean red meat - Choosing low-fat cooking methods, such as baking, broiling, steaming, grilling and boiling - Using low-fat or non-fat dairy products - Using vinaigrette, herbs, lemon or fat-free salad dressings - Avoiding fatty meats, such as bacon, sausage, franks, ribs and luncheon meats - Avoiding high-fat snacks like nuts, chips and chocolate - Avoiding fried foods - Using less butter, margarine, oil and mayonnaise - Avoiding high-fat gravies, cream sauces and cream-based soups  3) Eat a variety of foods, including: - Fruit and vegetables  that are raw, steamed or baked - Whole grains, breads, cereal, rice and pasta - Dairy products, such as low-fat or non-fat milk or yogurt, low-fat cottage cheese and low-fat cheese - Protein-rich foods like chicken, [place], fish, lean meat and legumes, or beans  4) Change your eating habits by: - Eat three balanced meals a day to help control your hunger - Watch portion sizes and eat small servings of a variety of foods - Choose low-calorie snacks - Eat only when you are hungry and stop when you are satisfied - Eat slowly and try  not to perform other tasks while eating - Find other activities to distract you from food, such as walking, taking up a hobby or being involved in the community - Include regular exercise in your daily routine ( minimum of 20 min of moderate-intensity exercise at least 5 days/week)  - Find a support group, if necessary, for emotional support in your weight loss journey           Easy ways to cut 100 calories   1. Eat your eggs with hot sauce OR salsa instead of cheese.  Eggs are great for breakfast, but many people consider eggs and cheese to be BFFs. Instead of cheese-1 oz. of cheddar has 114 calories-top your eggs with hot sauce, which contains no calories and helps with satiety and metabolism. Salsa is also a great option!!  2. Top your toast, waffles or pancakes with fresh berries instead of jelly or syrup. Half a cup of berries-fresh, frozen or thawed-has about 40 calories, compared with 2 tbsp. of maple syrup or jelly, which both have about 100 calories. The berries will also give you a good punch of fiber, which helps keep you full and satisfied and won't spike blood sugar quickly like the jelly or syrup. 3. Swap the non-fat latte for black coffee with a splash of half-and-half. Contrary to its name, that non-fat latte has 130 calories and a startling 19g of carbohydrates per 16 oz. serving. Replacing that 'light' drinkable dessert with a black coffee  with a splash of half-and-half saves you more than 100 calories per 16 oz. serving. 4. Sprinkle salads with freeze-dried raspberries instead of dried cranberries. If you want a sweet addition to your nutritious salad, stay away from dried cranberries. They have a whopping 130 calories per  cup and 30g carbohydrates. Instead, sprinkle freeze-dried raspberries guilt-free and save more than 100 calories per  cup serving, adding 3g of belly-filling fiber. 5. Go for mustard in place of mayo on your sandwich. Mustard can add really nice flavor to any sandwich, and there are tons of varieties, from spicy to honey. A serving of mayo is 95 calories, versus 10 calories in a serving of mustard.  Or try an avocado mayo spread: You can find the recipe few click this link: https://www.californiaavocado.com/recipes/recipe-container/[place]-avocado-mayo 6. Choose a DIY salad dressing instead of the store-bought kind. Mix Dijon or whole grain mustard with low-fat Kefir or red wine vinegar and garlic. 7. Use hummus as a spread instead of a dip. Use hummus as a spread on a high-fiber cracker or tortilla with a sandwich and save on calories without sacrificing taste. 8. Pick just one salad "accessory." Salad isn't automatically a calorie winner. It's easy to over-accessorize with toppings. Instead of topping your salad with nuts, avocado and cranberries (all three will clock in at 313 calories), just pick one. The next day, choose a different accessory, which will also keep your salad interesting. You don't wear all your jewelry every day, right? 9. Ditch the white pasta in favor of spaghetti squash. One cup of cooked spaghetti squash has about 40 calories, compared with traditional spaghetti, which comes with more than 200. Spaghetti squash is also nutrient-dense. It's a good source of fiber and Vitamins A and C, and it can be eaten just like you would eat pasta-with a great tomato sauce and [place] meatballs or with  pesto, tofu and spinach, for example. 10. Dress up your chili, soups and stews with non-fat Greek yogurt instead of sour cream. Just a 'dollop'  of sour cream can set you back 115 calories and a whopping 12g of fat-seven of which are of the artery-clogging variety. Added bonus: [place] yogurt is packed with muscle-building protein, calcium and B Vitamins. 11. Mash cauliflower instead of mashed potatoes. One cup of traditional mashed potatoes-in all their creamy goodness-has more than 200 calories, compared to mashed cauliflower, which you can typically eat for less than 100 calories per 1 cup serving. Cauliflower is a great source of the antioxidant indole-3-carbinol (I3C), which may help reduce the risk of some cancers, like breast cancer. 12. Ditch the ice cream sundae in favor of a [place] yogurt parfait. Instead of a cup of ice cream or fro-yo for dessert, try 1 cup of nonfat Greek yogurt topped with fresh berries and a sprinkle of cacao nibs. Both toppings are packed with antioxidants, which can help reduce cellular inflammation and oxidative damage. And the comparison is a no-brainer: One cup of ice cream has about 275 calories; one cup of frozen yogurt has about 230; and a cup of Greek yogurt has just 130, plus twice the protein, so you're less likely to return to the freezer for a second helping. 13. Put olive oil in a spray container instead of using it directly from the bottle. Each tablespoon of olive oil is 120 calories and 15g of fat. Use a mister instead of pouring it straight into the pan or onto a salad. This allows for portion control and will save you more than 100 calories. 14. When baking, substitute canned pumpkin for butter or oil. Canned pumpkin-not pumpkin pie mix-is loaded with Vitamin A, which is important for skin and eye health, as well as immunity. And the comparisons are pretty crazy:  cup of canned pumpkin has about 40 calories, compared to butter or oil, which has more than 800  calories. Yes, 800 calories. Applesauce and mashed banana can also serve as good substitutions for butter or oil, usually in a 1:1 ratio. 15. Top casseroles with high-fiber cereal instead of breadcrumbs. Breadcrumbs are typically made with white bread, while breakfast cereals contain 5-9g of fiber per serving. Not only will you save more than 150 calories per  cup serving, the swap will also keep you more full and you'll get a metabolism boost from the added fiber. 16. Snack on pistachios instead of macadamia nuts. Believe it or not, you get the same amount of calories from 35 pistachios (100 calories) as you would from only five macadamia nuts. 17. Chow down on kale chips rather than potato chips. This is my favorite 'don't knock it 'till you try it' swap. Kale chips are so easy to make at home, and you can spice them up with a little grated parmesan or chili powder. Plus, they're a mere fraction of the calories of potato chips, but with the same crunch factor we crave so often. 18. Add seltzer and some fruit slices to your cocktail instead of soda or fruit juice. One cup of soda or fruit juice can pack on as much as 140 calories. Instead, use seltzer and fruit slices. The fruit provides valuable phytochemicals, such as flavonoids and anthocyanins, which help to combat cancer and stave off the aging process.

## 2018-01-05 NOTE — Progress Notes (Signed)
Wt loss OV note   Impression and Recommendations:    1. Overweight (BMI 25.0-29.9)     1) Weight Management: Explained to patient what BMI refers to, and what it means medically.  Told patient to think about it as a "medical risk stratification measurement" and how increasing BMI is associated with increasing risk/ or worsening state of various diseases such as hypertension, hyperlipidemia, diabetes, premature OA, depression etc.  American Heart Association guidelines for healthy diet, basically Mediterranean diet, and exercise guidelines of 30 minutes 5 days per week or more discussed in detail.  -Reminded patient the need for yearly complete physical exam office visits in addition to office visits for management of the chronic diseases  - Reviewed tracking nutritional intake with the [employer] app with patient in detail today.  Discussed that half of intake must be protein.  - Compliance with Nutritional Tracking Patient has not been tracking her evening meals. Since last seen, she only tracked probably 1/10th of the time. The bulk of patient's food per day is fats and carbs with very little protein.  - Discussed the importance of tracking everything, including ways to improve accuracy of tracking with patient.  - Discussed meal prepping as key to success when it comes to weight loss.  - Significant portion of appointment was spent educating patient about healthier food options.  2) BMI Counseling: Explained to patient what BMI refers to, and what it means medically.  Told patient to think about it as a "medical risk stratification measurement" and how increasing BMI is associated with increasing risk/ or worsening state of various diseases such as hypertension, hyperlipidemia, diabetes, premature OA, depression etc.  American Heart Association guidelines for healthy diet, basically Mediterranean diet, and exercise guidelines of 30 minutes 5 days per week or more discussed in  detail.  Health counseling performed.  All questions answered.  3) Lifestyle & Preventative Health Maintenance: - Advised patient to continue working toward exercising to improve overall mental, physical, and emotional health.    - Encouraged patient to engage in daily physical activity, especially a formal exercise routine.  Recommended that the patient eventually strive for at least 150 minutes of moderate cardiovascular activity per week according to guidelines established by the [facility].   - Healthy dietary habits encouraged, including low-carb, and high amounts of lean protein in diet.   - Patient should also consume adequate amounts of water - half of body weight in oz of water per day.   Education and routine counseling performed. Handouts provided.  4) Follow-Up: - Meet in another month or two with nutritional tracking on LoseIt app. - Goal is TRACKING EVERY DAY, not weight loss. - Patient wants to track all of November and December and return Mar 10, 2018.  - Otherwise, continue to return for CPE and chronic follow-up as scheduled.   - Patient knows to call in sooner if desired to address acute concerns.    The patient was counseled, risk factors were discussed, anticipatory guidance given.  -Gross side effects, risk and benefits, and alternatives of medications discussed with patient.  Patient is aware that all medications have potential side effects and we are unable to predict every side effect or drug-drug interaction that may occur.  Expresses verbal understanding and consents to current therapy plan and treatment regimen.   Return for Mid-January weight loss.   Please see AVS handed out to patient at the end of our visit for further patient instructions/ counseling done pertaining  to today's office visit.    Note:  This document was prepared using Dragon voice recognition software and may include unintentional dictation errors.    This document serves as a record of  services personally performed by Mellody Dance, DO. It was created on her behalf by Toni Amend, a trained medical scribe. The creation of this record is based on the scribe's personal observations and the provider's statements to them.   I have reviewed the above medical documentation for accuracy and completeness and I concur.  Mellody Dance, D.O.    ________________________________________________________________   Subjective:  HPI: Kathleen Bowen y.o. female  presents for 3 month follow up for multiple medical problems.  Went to physical therapy, this helped with the numbness. States she "hasn't walked one bit (intentionally) since August the 16th."  Patient was exercising and losing no weight in the past.  She has been using the [employer] app to track periodically.  Patient has not been tracking everything she's eaten in the [facility] app.   Weight:  Wt Readings from Last 3 Encounters:  01/05/18 193 lb (87.5 kg)  10/13/17 192 lb 11.2 oz (87.4 kg)  08/19/17 191 lb (86.6 kg)   BMI Readings from Last 3 Encounters:  01/05/18 29.35 kg/m  10/13/17 29.30 kg/m  08/19/17 28.62 kg/m   Lab Results  Component Value Date   HGBA1C 5.0 11/18/2016    Review of Systems: General:   No F/C, wt loss Pulm:   No DIB, SOB, pleuritic chest pain Card:  No CP, palpitations Abd:  No n/v/d or pain Ext:  No inc edema from baseline   Objective: Physical Exam: BP 125/74   Pulse 67   Ht 5' 8"  (1.727 m)   Wt 193 lb (87.5 kg)   SpO2 98%   BMI 29.35 kg/m  Body mass index is 29.35 kg/m. General: Well nourished, in no apparent distress. Eyes: PERRLA, EOMs, conjunctiva clr no swelling or erythema ENT/Mouth: Hearing appears normal.  Mucus Membranes Moist  Neck: Supple, no masses Resp: Respiratory effort- normal, ECTA B/L w/o W/R/R  Cardio: RRR w/o MRGs. Abdomen: no gross distention. Lymphatics:  Brisk peripheral pulses, less 2 sec cap RF, no gross edema  M-sk: Full ROM,  5/5 strength, normal gait.  Skin: Warm, dry without rashes, lesions, ecchymosis.  Neuro: Alert, Oriented Psych: Normal affect, Insight and Judgment appropriate.    Current Medications:  Current Outpatient Medications on File Prior to Visit  Medication Sig Dispense Refill  . norethindrone-ethinyl estradiol (JUNEL FE,GILDESS FE,LOESTRIN FE) 1-20 MG-MCG tablet Take 1 tablet by mouth daily.     No current facility-administered medications on file prior to visit.     Medical History:  Patient Active Problem List   Diagnosis Date Noted  . Overweight (BMI 25.0-29.9) 01/05/2018  . Abnormal mammogram of right breast 10/27/2016  . SK (solar keratosis) 10/27/2016  . Status post device closure of ASD- with [phone] 10/27/2016  . Chronic sinusitis-  10/27/2016  . Family history of coronary artery disease in brother 10/27/2016  . 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 Family history of malignant melanoma of skin- sis around age 84ish 10/27/2016  . Obesity (BMI 30-39.9) 10/27/2016    Allergies:  No Known Allergies   Family history-  Reviewed; changed as appropriate  Social history-  Reviewed; changed as appropriate

## 2018-03-24 ENCOUNTER — Ambulatory Visit: Payer: BLUE CROSS/BLUE SHIELD | Admitting: Family Medicine

## 2018-05-25 ENCOUNTER — Telehealth: Payer: Self-pay | Admitting: Family Medicine

## 2018-05-25 NOTE — Telephone Encounter (Signed)
Spoke to patient and notified that per Mina Marble, NP to continue antibiotics and follow up with [place] on Thursday if no improvement or sooner if symptoms worsen. MPulliam, CMA/RT(R)

## 2018-05-25 NOTE — Telephone Encounter (Signed)
Patient left VM stating she was seen at [facility] on Saturday and was given abx for treatment. She states she is not feeling any better and is complaining of chest tightness as well as SOB. Please advise patient was next step would be.

## 2018-05-27 ENCOUNTER — Ambulatory Visit: Payer: BLUE CROSS/BLUE SHIELD | Admitting: Adult Health

## 2018-05-27 ENCOUNTER — Other Ambulatory Visit: Payer: Self-pay

## 2018-05-27 ENCOUNTER — Encounter: Payer: Self-pay | Admitting: Adult Health

## 2018-05-27 VITALS — BP 111/72 | HR 68 | Temp 98.8°F | Ht 68.0 in | Wt 198.5 lb

## 2018-05-27 DIAGNOSIS — R05 Cough: Secondary | ICD-10-CM | POA: Diagnosis not present

## 2018-05-27 DIAGNOSIS — R059 Cough, unspecified: Secondary | ICD-10-CM

## 2018-05-27 MED ORDER — ALBUTEROL SULFATE HFA 108 (90 BASE) MCG/ACT IN AERS: 2.0000 | INHALATION_SPRAY | Freq: Four times a day (QID) | RESPIRATORY_TRACT | 0 refills | Status: DC | PRN

## 2018-05-27 MED ORDER — PREDNISONE 20 MG PO TABS: ORAL_TABLET | ORAL | 0 refills | Status: DC

## 2018-05-27 NOTE — Progress Notes (Signed)
Subjective:    Patient ID: Kathleen Bowen, female    DOB: 01/26/1971, 48 y.o.   MRN: [id number]  HPI:  Kathleen Bowen presents with persistent non-productive cough for the last 8 days. She was seen at [facility] 05/23/2018- 4 days ago on 05/23/2018 Flu test Neg- dx'd with acute non-recurrent pansinusitis, treated with  Augmentin 875/153m- she hs had 4 days out of 7 day course. In addition to cough, she reports chest tightness, clear nasal drainage, and states "I just can't get a deep breath". She also has mild sore throat (2/10), and temporal HA (2/10) However she denies fever/shortness of breath/night sweats/chills/N/V/D She did have a low grade fever last week- highest 99.747fral She [place] any recent [place] and was exposed to Influenza at local Nursing home in Feb 2020, again Influenza test was neg on 05/23/2018 She denies known COVID-19 exposure She has been using OTC Ibuprofen, last dose >4 days ago, current temp [id number] Shew denies tobacco/vape use  Patient Care Team    Relationship Specialty Notifications Start End  OpaMellody DanceO PCP - General Family Medicine  10/27/16   FogAloha GellD Consulting Physician Obstetrics and Gynecology  10/27/16   Dermatology, [place]ysician   10/27/16    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 Cough in adult 05/27/2018  . Overweight (BMI 25.0-29.9) 01/05/2018  . Abnormal mammogram of right breast 10/27/2016  . SK (solar keratosis) 10/27/2016  . Status post device closure of ASD- with pat([phone]/20/2018  . Chronic sinusitis-  10/27/2016  . Family history of coronary artery disease in brother 10/27/2016  . 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 Family history of malignant melanoma of skin- sis around age 45i53ish/20/2018  . Obesity (BMI 30-39.9) 10/27/2016     Past Medical History:  Diagnosis Date   . Heart disease   . Heart murmur   . Hx of PTL (preterm labor), current pregnancy      Past Surgical History:  Procedure Laterality Date  . ASD REPAIR  1992  . BREAST BIOPSY Right 05/14/2015   Benign  . HEMORROIDECTOMY    . MOHS SURGERY     PRECANCEROUS SKIN REMOVED  . TONSILLECTOMY       Family History  Problem Relation Age of Onset  . Skin cancer Sister   . Liver cancer Paternal Grandfather   . Alcoholism Paternal Grandfather   . Heart disease Mother        PACE MAKER  . Skin cancer Mother        ON NOSE  . Atrial fibrillation Mother   . Thrombophlebitis Daughter   . ADD / ADHD Daughter   . Hypertension Father   . Diabetes Paternal Grandmother   . Heart attack Maternal Grandmother   . Heart attack Brother   . ADD / ADHD Son      Social History   Substance and Sexual Activity  Drug Use No     Social History   Substance and Sexual Activity  Alcohol Use No     Social History   Tobacco Use  Smoking Status Never Smoker  Smokeless Tobacco Never Used     Outpatient Encounter Medications as of 05/27/2018  Medication Sig  . amoxicillin-clavulanate (AUGMENTIN) 875-125 MG tablet Take 1 tablet by mouth 2 (two) times daily.  . norethindrone-ethinyl estradiol (JUNEL FE,GILDESS FE,LOESTRIN  FE) 1-20 MG-MCG tablet Take 1 tablet by mouth daily.  Marland Kitchen albuterol (PROVENTIL HFA;VENTOLIN HFA) 108 (90 Base) MCG/ACT inhaler Inhale 2 puffs into the lungs every 6 (six) hours as needed for wheezing or shortness of breath.  . predniSONE (DELTASONE) 20 MG tablet 1 tab very 12 hrs for 3 days, then 1 tab daily for 3 days   No facility-administered encounter medications on file as of 05/27/2018.     Allergies: Patient has no known allergies.  Body mass index is 30.18 kg/m.  Blood pressure 111/72, pulse 68, temperature 98.8 F (37.1 C), height 5' 8"  (1.727 m), weight 198 lb 8 oz (90 kg), SpO2 99 %.  Review of Systems  Constitutional: Positive for fatigue. Negative for  activity change, appetite change, chills, diaphoresis, fever and unexpected weight change.  HENT: Positive for congestion, postnasal drip, sinus pressure and sore throat. Negative for ear discharge, ear pain, facial swelling, hearing loss, mouth sores, rhinorrhea, sinus pain, trouble swallowing and voice change.   Eyes: Negative for visual disturbance.  Respiratory: Positive for cough. Negative for chest tightness, shortness of breath, wheezing and stridor.   Cardiovascular: Negative for chest pain, palpitations and leg swelling.  Gastrointestinal: Negative for abdominal distention, anal bleeding, blood in stool, diarrhea, nausea and vomiting.  Genitourinary: Negative for difficulty urinating and flank pain.  Neurological: Positive for headaches. Negative for dizziness.  Hematological: Does not bruise/bleed easily.       Objective:   Physical Exam Vitals signs and nursing note reviewed.  Constitutional:      General: She is not in acute distress.    Appearance: She is not ill-appearing, toxic-appearing or diaphoretic.  HENT:     Head: Normocephalic and atraumatic.     Right Ear: No decreased hearing noted. Tympanic membrane is bulging. Tympanic membrane is not erythematous.     Left Ear: No decreased hearing noted. Tympanic membrane is bulging. Tympanic membrane is not erythematous.     Nose: Mucosal edema, congestion and rhinorrhea present.     Right Turbinates: Swollen.     Left Turbinates: Swollen.     Right Sinus: No maxillary sinus tenderness or frontal sinus tenderness.     Left Sinus: No maxillary sinus tenderness or frontal sinus tenderness.     Mouth/Throat:     Pharynx: Posterior oropharyngeal erythema present. No oropharyngeal exudate.     Tonsils: No tonsillar exudate. 1+ on the right. 1+ on the left.  Eyes:     Extraocular Movements: Extraocular movements intact.     Conjunctiva/sclera: Conjunctivae normal.     Pupils: Pupils are equal, round, and reactive to light.   Neck:     Musculoskeletal: Normal range of motion.  Cardiovascular:     Rate and Rhythm: Normal rate.     Pulses: Normal pulses.     Heart sounds: Normal heart sounds. No murmur. No friction rub. No gallop.   Pulmonary:     Effort: Pulmonary effort is normal. No respiratory distress.     Breath sounds: Normal breath sounds. No stridor. No wheezing, rhonchi or rales.  Chest:     Chest wall: No tenderness.  Lymphadenopathy:     Cervical: No cervical adenopathy.  Skin:    General: Skin is warm and dry.     Capillary Refill: Capillary refill takes less than 2 seconds.  Neurological:     Mental Status: She is alert and oriented to person, place, and time.  Psychiatric:        Mood and Affect: Mood  normal.        Behavior: Behavior normal.        Thought Content: Thought content normal.        Judgment: Judgment normal.       Assessment & Plan:   1. Cough in adult     Cough in adult Please complete course of Augmentin. Please take Prednisone as directed and ProAir as needed. Increase water- strive for half of your body weight in oz per day. If symptoms persist after Prednisone complete, please call clinic and we will send in course of Doxycycline.  FOLLOW-UP:  Return if symptoms worsen or fail to improve.

## 2018-05-27 NOTE — Patient Instructions (Signed)
Cough, Adult  Coughing is a reflex that clears your throat and your airways. Coughing helps to heal and protect your lungs. It is normal to cough occasionally, but a cough that happens with other symptoms or lasts a long time may be a sign of a condition that needs treatment. A cough may last only 2-3 weeks (acute), or it may last longer than 8 weeks (chronic). What are the causes? Coughing is commonly caused by:  Breathing in substances that irritate your lungs.  A viral or bacterial respiratory infection.  Allergies.  Asthma.  Postnasal drip.  Smoking.  Acid backing up from the stomach into the esophagus (gastroesophageal reflux).  Certain medicines.  Chronic lung problems, including COPD (or rarely, lung cancer).  Other medical conditions such as heart failure. Follow these instructions at home: Pay attention to any changes in your symptoms. Take these actions to help with your discomfort:  Take medicines only as told by your health care provider. ? If you were prescribed an antibiotic medicine, take it as told by your health care provider. Do not stop taking the antibiotic even if you start to feel better. ? Talk with your health care provider before you take a cough suppressant medicine.  Drink enough fluid to keep your urine clear or pale yellow.  If the air is dry, use a cold steam vaporizer or humidifier in your bedroom or your home to help loosen secretions.  Avoid anything that causes you to cough at work or at home.  If your cough is worse at night, try sleeping in a semi-upright position.  Avoid cigarette smoke. If you smoke, quit smoking. If you need help quitting, ask your health care provider.  Avoid caffeine.  Avoid alcohol.  Rest as needed. Contact a health care provider if:  You have new symptoms.  You cough up pus.  Your cough does not get better after 2-3 weeks, or your cough gets worse.  You cannot control your cough with suppressant  medicines and you are losing sleep.  You develop pain that is getting worse or pain that is not controlled with pain medicines.  You have a fever.  You have unexplained weight loss.  You have night sweats. Get help right away if:  You cough up blood.  You have difficulty breathing.  Your heartbeat is very fast. This information is not intended to replace advice given to you by your health care provider. Make sure you discuss any questions you have with your health care provider. Document Released: 08/23/2010 Document Revised: 08/02/2015 Document Reviewed: 05/03/2014 Elsevier Interactive Patient Education  2019 [place] (COVID-19) Are you at risk?  Are you at risk for the Coronavirus (COVID-19)?  To be considered HIGH RISK for Coronavirus (COVID-19), you have to meet the following criteria:  . Traveled to [place], [place], [place], [place] or [place]; or in the [place] to [place], [place], [place], or [place]; and have fever, cough, and shortness of breath within the last 2 weeks of travel OR . Been in close contact with a person diagnosed with COVID-19 within the last 2 weeks and have fever, cough, and shortness of breath . IF YOU DO NOT MEET THESE CRITERIA, YOU ARE CONSIDERED LOW RISK FOR COVID-19.  What to do if you are HIGH RISK for COVID-19?  Marland Kitchen If you are having a medical emergency, call 911. . Seek medical care right away. Before you go to a doctor's office, urgent care or emergency department, call  ahead and tell them about your recent travel, contact with someone diagnosed with COVID-19, and your symptoms. You should receive instructions from your physician's office regarding next steps of care.  . When you arrive at healthcare provider, tell the healthcare staff immediately you have returned from visiting [place], [place], [place], [place] or [place]; or traveled in the [place] to [place], [place], [place], or [place]; in the last  two weeks or you have been in close contact with a person diagnosed with COVID-19 in the last 2 weeks.   . Tell the health care staff about your symptoms: fever, cough and shortness of breath. . After you have been seen by a medical provider, you will be either: o Tested for (COVID-19) and discharged home on quarantine except to seek medical care if symptoms worsen, and asked to  - Stay home and avoid contact with others until you get your results (4-5 days)  - Avoid travel on public transportation if possible (such as bus, train, or airplane) or o Sent to the Emergency Department by EMS for evaluation, COVID-19 testing, and possible admission depending on your condition and test results.  What to do if you are LOW RISK for COVID-19?  Reduce your risk of any infection by using the same precautions used for avoiding the common cold or flu:  Marland Kitchen Wash your hands often with soap and warm water for at least 20 seconds.  If soap and water are not readily available, use an alcohol-based hand sanitizer with at least 60% alcohol.  . If coughing or sneezing, cover your mouth and nose by coughing or sneezing into the elbow areas of your shirt or coat, into a tissue or into your sleeve (not your hands). . Avoid shaking hands with others and consider head nods or verbal greetings only. . Avoid touching your eyes, nose, or mouth with unwashed hands.  . Avoid close contact with people who are sick. . Avoid places or events with large numbers of people in one location, like concerts or sporting events. . Carefully consider travel plans you have or are making. . If you are planning any travel outside or inside the [place], visit the CDC's Travelers' Health webpage for the latest health notices. . If you have some symptoms but not all symptoms, continue to monitor at home and seek medical attention if your symptoms worsen. . If you are having a medical emergency, call 911.   [place] / e-Visit: [email or web address]         [facility]: [phone]  [facility]: [phone]                   [facility]: [phone]   Please complete course of Augmentin. Please take Prednisone as directed and ProAir as needed. Increase water- strive for half of your body weight in oz per day. If symptoms persist after Prednisone complete, please call clinic and we will send in course of Doxycycline. FEEL BETTER!

## 2018-05-27 NOTE — Assessment & Plan Note (Signed)
Please complete course of Augmentin. Please take Prednisone as directed and ProAir as needed. Increase water- strive for half of your body weight in oz per day. If symptoms persist after Prednisone complete, please call clinic and we will send in course of Doxycycline.

## 2018-09-30 ENCOUNTER — Telehealth: Payer: Self-pay | Admitting: Family Medicine

## 2018-09-30 DIAGNOSIS — Z20822 Contact with and (suspected) exposure to covid-19: Secondary | ICD-10-CM

## 2018-09-30 NOTE — Telephone Encounter (Signed)
Forwarding request to medical assistant that Patient & spouse have COVID 19 symptoms of :   Shortness of Breath ( neither can take a deep breaths) Bodyaches Weakness  Mild nausea   & are requested to be tested-- advised husband that Dr. Raliegh Scarlet is out of office & her assistant isn't here either,but would forward their request to covering Provider for review & COVID testing orders.   plse call them @ [phone] with any questions or concerns.  --glh

## 2018-09-30 NOTE — Telephone Encounter (Signed)
COVID order placed.  Please advise pt.  Charyl Bigger, CMA

## 2018-10-01 ENCOUNTER — Other Ambulatory Visit: Payer: Self-pay

## 2018-10-01 DIAGNOSIS — Z20822 Contact with and (suspected) exposure to covid-19: Secondary | ICD-10-CM

## 2018-10-05 ENCOUNTER — Telehealth: Payer: Self-pay | Admitting: Family Medicine

## 2018-10-05 ENCOUNTER — Telehealth: Payer: Self-pay

## 2018-10-05 LAB — NOVEL CORONAVIRUS, NAA: SARS-CoV-2, NAA: DETECTED — AB

## 2018-10-05 NOTE — Telephone Encounter (Signed)
Patient notified. MPulliam, CMA/RT(R)

## 2018-10-05 NOTE — Telephone Encounter (Signed)
Called patient with results of COVID testing.  Patient states that her symptoms have improved no fever in 3-4 days.  Still can't taste or smell but otherwise doing well.  Patient advised to rest push water and monitor symptoms.  Any red flag symptoms develop go to the ER at [facility]. Patient ecpressed understanding. MPulliam, CMA/RT(R)

## 2018-10-05 NOTE — Telephone Encounter (Signed)
Melissa as you can see Joellen Jersey ordered this last week when I was not here so the results went to her.  The test is positive.  Please see my note below and call patient with this information.  Thank you.   Call pt and let pt know test was positive-  meaning they are infected with the novel coronavirus and they could give the germ to others.   Please let pt know that folks 77 and older with chronic conditions are at highest risk  and younger folks with chronic conditions that are poorly controlled such as diabetes, hypertension, obesity, COPD are at increased risk as well.   Please ask pt if he/she is having any additional symptoms or concerns such as dyspnea - beyond what has already been documented in the chart.  Let me know if pt has developed worsening symptoms as this may need to be evaluated in an UC setting or ED. Please advise pt that if symptoms become severe and pt feels short of breath at rest and feels so tired they cannot get up out of bed to eat/ drink etc, please tell them to proceed to the ED or dial 911 - so they can obtain emergent supportive care.     . Please enroll patient in my chart if they are not already and also order "My chart Covid-19 companion" so patient can self monitor for worsening symptoms.  . Also please send appropriate paperwork and contact health department regarding this positive test.   Please explain this process to pt.   Please tell patient they will need to stay in self-isolation, away from all others, until pt meets these criteria: 1. 10 days have lapsed since when symptoms started or it's been 10d since positive test result 2. Also patient must be fever-free for over 24 hours without use of Tylenol or Advil etc. 3. And also patient must have some improvement in their symptoms.  -Once patient meets all 3 of these criteria above, they may end self-isolation, and resume "normal activities" (may return to work) using good preventative practices of social  distancing (>er 6 ft from others and avoid crowds), using masks every single time they leave their home, prudent handwashing etc.

## 2018-10-05 NOTE — Telephone Encounter (Signed)
-----   Message from Esaw Grandchild, NP sent at 10/05/2018  5:33 PM EDT ----- Good Afternoon Dr. Raliegh Scarlet Ms. Velez's COVID results- POSITIVE Please see her results Thanks! Valetta Fuller

## 2018-10-14 ENCOUNTER — Encounter: Payer: Self-pay | Admitting: Family Medicine

## 2018-10-14 ENCOUNTER — Telehealth: Payer: Self-pay | Admitting: Family Medicine

## 2018-10-14 NOTE — Telephone Encounter (Signed)
error 

## 2019-01-20 ENCOUNTER — Other Ambulatory Visit: Payer: Self-pay | Admitting: Obstetrics

## 2019-01-20 DIAGNOSIS — Z1231 Encounter for screening mammogram for malignant neoplasm of breast: Secondary | ICD-10-CM

## 2019-01-21 ENCOUNTER — Other Ambulatory Visit: Payer: Self-pay

## 2019-01-21 ENCOUNTER — Ambulatory Visit
Admission: RE | Admit: 2019-01-21 | Discharge: 2019-01-21 | Disposition: A | Discharge status: Home / Self Care | Payer: BC Managed Care – PPO | Source: Ambulatory Visit | Attending: Obstetrics | Admitting: Obstetrics

## 2019-01-21 ENCOUNTER — Telehealth: Payer: Self-pay | Admitting: Family Medicine

## 2019-01-21 DIAGNOSIS — G8929 Other chronic pain: Secondary | ICD-10-CM

## 2019-01-21 DIAGNOSIS — Z1231 Encounter for screening mammogram for malignant neoplasm of breast: Secondary | ICD-10-CM

## 2019-01-21 NOTE — Telephone Encounter (Signed)
Patient called while office clsd for lunch,left message stating needs appt w/ provider for (lower extremities Numbness).  ----- Pt states had the same issue last year or 2018 & was referred to OP Physical Therapy which seemed to work.  --Called pt back (no answer) Left her message that Provider is out of the office w/illness & advised her that all medical staff leave @ 12 noon &  that if she needs immediate medical attention she needs to go to nearest Urgent Care or ED (or if she has be referred to a specialist (Neuro or Ortho ) to contact them.  --Forwarding message to med staff as fyi & or to follow up w/ pt on Monday upon return to work@ [phone].  -glh

## 2019-01-21 NOTE — Telephone Encounter (Signed)
Patient called back states went to [place] in [place] @ [facility]   ---fyi to Dorothea Ogle to secure / referral & appt for pt .  --glh

## 2019-01-25 NOTE — Telephone Encounter (Signed)
Referral placed.  Charyl Bigger, CMA

## 2019-02-09 ENCOUNTER — Ambulatory Visit: Payer: BC Managed Care – PPO | Attending: Family Medicine | Admitting: Physical Therapy

## 2019-02-09 ENCOUNTER — Other Ambulatory Visit: Payer: Self-pay

## 2019-02-09 ENCOUNTER — Encounter: Payer: Self-pay | Admitting: Physical Therapy

## 2019-02-09 DIAGNOSIS — M25551 Pain in right hip: Secondary | ICD-10-CM

## 2019-02-09 DIAGNOSIS — M25552 Pain in left hip: Secondary | ICD-10-CM | POA: Insufficient documentation

## 2019-02-09 DIAGNOSIS — M545 Low back pain, unspecified: Secondary | ICD-10-CM

## 2019-02-09 DIAGNOSIS — G8929 Other chronic pain: Secondary | ICD-10-CM | POA: Diagnosis present

## 2019-02-09 NOTE — Addendum Note (Signed)
Addended by: Shelton Silvas on: 02/09/2019 03:12 PM   Modules accepted: Orders

## 2019-02-09 NOTE — Therapy (Signed)
[place] [facility] [address], [place], [address] Phone: [phone]   Fax:  [phone]  Physical Therapy Evaluation  Patient Details  Name: Kathleen Bowen MRN: [id number] Date of Birth: 04/12/1970 No data recorded  Encounter Date: 02/09/2019  PT End of Session - 02/09/19 1036    Visit Number  1    Number of Visits  17    Date for PT Re-Evaluation  04/06/19    PT Start Time  0240    PT Stop Time  1115    PT Time Calculation (min)  60 min    Activity Tolerance  Patient tolerated treatment well    Behavior During Therapy  [facility] for tasks assessed/performed       Past Medical History:  Diagnosis Date  . Heart disease   . Heart murmur   . Hx of PTL (preterm labor), current pregnancy     Past Surgical History:  Procedure Laterality Date  . ASD REPAIR  1992  . BREAST BIOPSY Right 05/14/2015   Benign  . HEMORROIDECTOMY    . MOHS SURGERY     PRECANCEROUS SKIN REMOVED  . TONSILLECTOMY      There were no vitals filed for this visit.   Subjective Assessment - 02/09/19 1022    Pertinent History  Patient is a 48 year old female presenting with bilat hip pain L>R. Is familar with this clinic as she was treated for similar onset of L hip pain last fall. Patinet has noticed progression hip pain over a month. Reports the only thing she has done differently is sleep on her mom's couch where she has been living to take care of her mother (since her mother had COVID in July). Pt is physically assisting in transferring her mother, though she can walk with RW short distances, describes assistance as modA for all transfers. 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 Pain is mostly in L glute with numbess down post LLE to front of the foot that comes with increased L glute pain. Reports her pain is made worse by crossing her legs, sitting for more than 19mns, and squatting to help her mom or to  retrieve items. Pain is better with ibprofen. Worst pain in past week 5/10, best 0/10, currently: 310. Endorses R lateral hip pain that comes on with L sided pain, but does not have a numbess/tingling component. Pt denies N/V, B&B changes, unexplained weight fluctuation, saddle paresthesia, fever, night sweats, or unrelenting night pain at this time.    Limitations  Lifting;Sitting    How long can you sit comfortably?  10-15 min    How long can you stand comfortably?  unlimited    How long can you walk comfortably?  unlimited    Diagnostic tests  None at this time    Patient Stated Goals  Decrease LLE numbess    Currently in Pain?  Yes    Pain Score  3     Pain Location  Hip    Pain Orientation  Left    Pain Descriptors / Indicators  Aching;Dull;Pins and needles    Pain Type  Chronic pain    Pain Radiating Towards  Dull ache at hip, pins and needles down post LLE    Pain Onset  More than a month ago    Pain Frequency  Intermittent    Aggravating Factors   prolonged sitting, squatting, lifting    Pain  Relieving Factors  ibprofen       .    OBJECTIVE  Mental Status Patient is oriented to person, place and time.  Recent memory is intact.  Remote memory is intact.  Attention span and concentration are intact.  Expressive speech is intact.  Patient's fund of knowledge is within normal limits for educational level.  SENSATION: Grossly intact to light touch bilateral LEs as determined by testing dermatomes L2-S2 Proprioception and hot/cold testing deferred on this date   MUSCULOSKELETAL: Tremor: None Bulk: Normal Tone: Normal No visible step-off along spinal column  Posture Lumbar lordosis: WNL Iliac crest height: equal bilaterally Lumbar lateral shift: negative Mild post pelvic tilt, "hanging on Y ligament" posture  Gait Slight hip drop, decreased DF with TKE at heel strike L>R  Palpation TTP with concordant ache pain at L piriformis, some tenderness over R piriformis.  [place] tenderness at R GT bursa (concordant sign) with tenderness along ITB   Strength (out of 5) R/L 5/5 Hip flexion 4/4 Hip ER 5/5 Hip IR 4+/4- Hip abduction 4+/4+ Hip adduction 4+/4+ Hip extension 5/5 Knee extension 5/5 Knee flexion 5/5 Ankle dorsiflexion 5/5 Ankle plantarflexion 5 Trunk flexion 5 Trunk extension 5/5 Trunk rotation  *Indicates pain   AROM (degrees) R/L (all movements include overpressure unless otherwise stated) Lumbar forward flexion (65): Full in sitting, limited in standing d/t hamstring tension bilat Lumbar extension (30): 25% limited Lumbar lateral flexion (25): WNL   Thoracic and Lumbar rotation (30 degrees):  WNL  Hip ext 20d bilat All other hip and knee motion WNL Ankle DF R: -4d L -10d *Indicates pain  PROM (degrees) PROM = AROM  Repeated Movements No centralization or peripheralization of symptoms with repeated lumbar extension or flexion.    Muscle Length Hamstrings: Positive bilat Ely: Negative bilat Thomas: Positive bilat Ober: Positive on R   Passive Accessory Intervertebral Motion (PAIVM) Pt denies reproduction of back pain with CPA L1-L5 and UPA bilaterally L1-L5. Generally hypomobile throughout  Passive Physiological Intervertebral Motion (PPIVM) Normal flexion and extension with PPIVM testing   SPECIAL TESTS Lumbar Radiculopathy and Discogenic: Centralization and Peripheralization (SN 92, -LR 0.12): Negative  Slump (SN 83, -LR 0.32): R: Negative L: Positive SLR (SN 92, -LR 0.29): Negative Crossed SLR (SP 90): Negative   Hip: FABER (SN 81): Negative bilat FADIR (SN 94): Positive bilat Hip scour (SN 50): Negative bilat  Functional Tasks Squatting: Heel raise with squat, post pelvic tilt with range beyond 100d  Ther-Ex Sciatic nerve glide supine 2 mins Piriformis stretch 10sec x3 Gastroc stretch 30sec hold Sidelying hip abd x10 each Education on ice for inflammation/bursitis pain of R hip and hip for glute  tension on L side, and nerve tension vs. Muscle tension with good verbalized understanding.             PT Education - 02/09/19 1035    Education Details  Patient was educated on diagnosis, anatomy and pathology involved, prognosis, role of PT, and was given an HEP, demonstrating exercise with proper form following verbal and tactile cues, and was given a paper hand out to continue exercise at home. Pt was educated on and agreed to plan of care    Person(s) Educated  Patient    Methods  Explanation;Demonstration;Tactile cues;Verbal cues;Handout    Comprehension  Verbalized understanding;Returned demonstration;Verbal cues required;Tactile cues required       PT Short Term Goals - 02/09/19 1036      PT SHORT TERM GOAL #1   Title  Pt  will be independent with HEP in order to improve strength and balance in order to decrease fall risk and improve function at home and work.    Baseline  02/09/19    Time  4    Period  Weeks    Status  New        PT Long Term Goals - 02/09/19 1454      PT LONG TERM GOAL #1   Title  Pt will increase LEFS by at least 9 points in order to demonstrate significant improvement in lower extremity function.    Baseline  02/09/19 64/80    Time  8    Period  Weeks    Status  New      PT LONG TERM GOAL #2   Title  Pt will decrease worst back pain as reported on NPRS by at least 2 points in order to demonstrate clinically significant reduction in back pain.    Baseline  02/09/19 5/10 with sitting >81mns and squatting/lifting    Time  8    Period  Weeks    Status  New      PT LONG TERM GOAL #3   Title  Pt will increase strength of by at least 1/2 MMT grade in order to demonstrate improvement in strength and function.    Baseline  02/08/09 see eval    Time  8    Period  Weeks    Status  New             Plan - 02/09/19 1457    Clinical Impression Statement  Patient is a 48year old female presenting with signs and symptoms of L piriformis  syndrome with sciatica, subsequent R hip bursitis/irritation from compensation. Impairments in lateral hip musculature strength, core strength, impaired hip flexibility, and pain. Activity limitations in squatting, lifting, sitting; inhibiting participation in caretaking for her mother, and completing household ADLs. Would benefit from skilled PT to address above deficits and promote optimal return to PLOF.    Personal Factors and Comorbidities  Age;Behavior Pattern;Comorbidity 1;Fitness    Comorbidities  obesity    Examination-Activity Limitations  Lift;Squat;Sit;Caring for Others    Examination-Participation Restrictions  Church;Cleaning;Community Activity    Stability/Clinical Decision Making  Evolving/Moderate complexity    Clinical Decision Making  Moderate    Rehab Potential  Good    PT Frequency  2x / week    PT Duration  8 weeks    PT Treatment/Interventions  Neuromuscular re-education;Passive range of motion;Manual techniques;Dry needling;Patient/family education;Therapeutic activities;Balance training;Therapeutic exercise;Electrical Stimulation;Cryotherapy;Ultrasound;Traction;Moist Heat;Functional mobility training;Aquatic Therapy;ADLs/Self Care Home Management;Iontophoresis 428mml Dexamethasone;Gait training;Taping    PT Next Visit Plan  post hip strengthening/hip mobs and manual for soft tissue restrictions    PT Home Exercise Plan  Sciatic nerve glide, piriformis stretch, gastroc stretch, sidelying hip abd    Consulted and Agree with Plan of Care  Patient       Patient will benefit from skilled therapeutic intervention in order to improve the following deficits and impairments:  Abnormal gait, Increased fascial restricitons, Impaired sensation, Improper body mechanics, Pain, Impaired tone, Postural dysfunction, Increased muscle spasms, Decreased mobility, Decreased activity tolerance, Decreased range of motion, Decreased strength, Difficulty walking, Impaired flexibility, Decreased  endurance  Visit Diagnosis: Pain in left hip  Chronic left-sided low back pain without sciatica  Pain in right hip     Problem List Patient Active Problem List   Diagnosis Date Noted  . Cough in adult 05/27/2018  . Overweight (BMI 25.0-29.9) 01/05/2018  .  Abnormal mammogram of right breast 10/27/2016  . SK (solar keratosis) 10/27/2016  . Status post device closure of ASD- with [phone] 10/27/2016  . Chronic sinusitis-  10/27/2016  . Family history of coronary artery disease in brother 10/27/2016  . 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 Family history of malignant melanoma of skin- sis around age 21ish 10/27/2016  . Obesity (BMI 30-39.9) 10/27/2016   Kathleen Bowen PT, DPT Kathleen Bowen 02/09/2019, 3:08 PM   [facility] [address], [place], [address] Phone: [phone]   Fax:  [phone]  Name: Kathleen Bowen MRN: [id number] Date of Birth: 1970-11-28

## 2019-02-14 ENCOUNTER — Ambulatory Visit: Payer: BC Managed Care – PPO | Admitting: Physical Therapy

## 2019-02-14 ENCOUNTER — Other Ambulatory Visit: Payer: Self-pay

## 2019-02-14 ENCOUNTER — Encounter: Payer: Self-pay | Admitting: Physical Therapy

## 2019-02-14 DIAGNOSIS — M25552 Pain in left hip: Secondary | ICD-10-CM | POA: Diagnosis not present

## 2019-02-14 DIAGNOSIS — M545 Low back pain: Secondary | ICD-10-CM

## 2019-02-14 DIAGNOSIS — G8929 Other chronic pain: Secondary | ICD-10-CM

## 2019-02-14 DIAGNOSIS — M25551 Pain in right hip: Secondary | ICD-10-CM

## 2019-02-14 NOTE — Therapy (Signed)
[place] [address], [place], [address] Phone: [phone]   Fax:  [phone]  Physical Therapy Treatment  Patient Details  Name: Kathleen Bowen MRN: [id number] Date of Birth: 30-Jun-1970 No data recorded  Encounter Date: 02/14/2019  PT End of Session - 02/14/19 1649    Visit Number  2    Number of Visits  17    Date for PT Re-Evaluation  04/06/19    PT Start Time  0230    PT Stop Time  0315    PT Time Calculation (min)  45 min    Activity Tolerance  Patient tolerated treatment well    Behavior During Therapy  [facility] for tasks assessed/performed       Past Medical History:  Diagnosis Date  . Heart disease   . Heart murmur   . Hx of PTL (preterm labor), current pregnancy     Past Surgical History:  Procedure Laterality Date  . ASD REPAIR  1992  . BREAST BIOPSY Right 05/14/2015   Benign  . HEMORROIDECTOMY    . MOHS SURGERY     PRECANCEROUS SKIN REMOVED  . TONSILLECTOMY      There were no vitals filed for this visit.  Subjective Assessment - 02/14/19 1439    Subjective  Reports some increased pain with R lateral hip pain with hip abd HEP. Reports stretching is going well. 3/10 L hip pain today, 5/10 R hip pain.    Pertinent History  Patient is a 48 year old female presenting with bilat hip pain L>R. Is familar with this clinic as she was treated for similar onset of L hip pain last fall. Patinet has noticed progression hip pain over a month. Reports the only thing she has done differently is sleep on her mom's couch where she has been living to take care of her mother (since her mother had COVID in July). Pt is physically assisting in transferring her mother, though she can walk with RW short distances, describes assistance as modA for all transfers. 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 Pain is mostly in L glute with numbess down post LLE to front of the foot  that comes with increased L glute pain. Reports her pain is made worse by crossing her legs, sitting for more than 4mns, and squatting to help her mom or to retrieve items. Pain is better with ibprofen. Worst pain in past week 5/10, best 0/10, currently: 310. Endorses R lateral hip pain that comes on with L sided pain, but does not have a numbess/tingling component. Pt denies N/V, B&B changes, unexplained weight fluctuation, saddle paresthesia, fever, night sweats, or unrelenting night pain at this time.    Limitations  Lifting;Sitting    How long can you sit comfortably?  10-15 min    How long can you stand comfortably?  unlimited    How long can you walk comfortably?  unlimited    Diagnostic tests  None at this time    Patient Stated Goals  Decrease LLE numbess    Pain Onset  More than a month ago       Ther-Ex - Figure 4 stretch 2x 30sec hold - Supine nerve glide 124m with much better hamstring mobility than eval - Incline bilat gastroc stretch small incline 98m79m- Squat with overhead pipe 3x 10 with demo for proper technique with excellent carry over   Manual STM with trigger point  release to R glute med, lateral focus; L glute max over piriformis/deep rotators Following: Dry Needling: (2/2) 132m .25 needles placed along the R glute med (lateral) and L piriformis/glute max to decrease increased muscular spasms and trigger points with the patient positioned in prone. Patient was educated on risks and benefits of therapy and verbally consents to PT.  Sidelying R hip ext > add (Ober) stretch 10x 10sec hold, increasing stretch as able                       PT Education - 02/14/19 1649    Education Details  therex form; TDN    Person(s) Educated  Patient    Methods  Explanation;Demonstration;Verbal cues;Tactile cues    Comprehension  Verbalized understanding;Returned demonstration;Verbal cues required;Tactile cues required       PT Short Term Goals - 02/09/19 1036       PT SHORT TERM GOAL #1   Title  Pt will be independent with HEP in order to improve strength and balance in order to decrease fall risk and improve function at home and work.    Baseline  02/09/19    Time  4    Period  Weeks    Status  New        PT Long Term Goals - 02/09/19 1454      PT LONG TERM GOAL #1   Title  Pt will increase LEFS by at least 9 points in order to demonstrate significant improvement in lower extremity function.    Baseline  02/09/19 64/80    Time  8    Period  Weeks    Status  New      PT LONG TERM GOAL #2   Title  Pt will decrease worst back pain as reported on NPRS by at least 2 points in order to demonstrate clinically significant reduction in back pain.    Baseline  02/09/19 5/10 with sitting >157ms and squatting/lifting    Time  8    Period  Weeks    Status  New      PT LONG TERM GOAL #3   Title  Pt will increase strength of by at least 1/2 MMT grade in order to demonstrate improvement in strength and function.    Baseline  02/08/09 see eval    Time  8    Period  Weeks    Status  New            Plan - 02/14/19 1652    Clinical Impression Statement  PT utilized manual with TDN techniques to decrease pain, muscle tension, and increase ROM with good success. Patient reports decreased pain following with increased motion with stretching, and better functional squat mechanics following. Continued education on squat mechanics with lifting, and posture for carry over of gains made during session with good understanding. Will continue progression as able.    Personal Factors and Comorbidities  Age;Behavior Pattern;Comorbidity 1;Fitness    Comorbidities  obesity    Examination-Activity Limitations  Lift;Squat;Sit;Caring for Others    Examination-Participation Restrictions  Church;Cleaning;Community Activity    Stability/Clinical Decision Making  Evolving/Moderate complexity    Clinical Decision Making  Moderate    Rehab Potential  Good    Clinical  Impairments Affecting Rehab Potential  (+) motivation to pursue more active lifestyle, fairly young age, social support (-) scheduling    PT Frequency  2x / week    PT Duration  8 weeks    PT  Treatment/Interventions  Neuromuscular re-education;Passive range of motion;Manual techniques;Dry needling;Patient/family education;Therapeutic activities;Balance training;Therapeutic exercise;Electrical Stimulation;Cryotherapy;Ultrasound;Traction;Moist Heat;Functional mobility training;Aquatic Therapy;ADLs/Self Care Home Management;Iontophoresis 35m/ml Dexamethasone;Gait training;Taping    PT Next Visit Plan  post hip strengthening/hip mobs and manual for soft tissue restrictions    PT Home Exercise Plan  Sciatic nerve glide, piriformis stretch, gastroc stretch, sidelying hip abd    Consulted and Agree with Plan of Care  Patient       Patient will benefit from skilled therapeutic intervention in order to improve the following deficits and impairments:  Abnormal gait, Increased fascial restricitons, Impaired sensation, Improper body mechanics, Pain, Impaired tone, Postural dysfunction, Increased muscle spasms, Decreased mobility, Decreased activity tolerance, Decreased range of motion, Decreased strength, Difficulty walking, Impaired flexibility, Decreased endurance  Visit Diagnosis: Pain in left hip  Chronic left-sided low back pain without sciatica  Pain in right hip     Problem List Patient Active Problem List   Diagnosis Date Noted  . Cough in adult 05/27/2018  . Overweight (BMI 25.0-29.9) 01/05/2018  . Abnormal mammogram of right breast 10/27/2016  . SK (solar keratosis) 10/27/2016  . Status post device closure of ASD- with [phone]/20/2018  . Chronic sinusitis-  10/27/2016  . Family history of coronary artery disease in brother 10/27/2016  . 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 Family history of malignant melanoma of skin- sis around age 3288ish08/20/2018  . Obesity (BMI 30-39.9)  10/27/2016   CShelton SilvasPT, DPT CShelton Silvas12/09/2018, 4:54 PM  [facility] [address] [place] [address]Phone: [phone]  Fax:  [phone] Name: KRAMINA HULETMRN: [id number] of Birth: 507-15-1972

## 2019-02-18 ENCOUNTER — Other Ambulatory Visit: Payer: Self-pay

## 2019-02-18 ENCOUNTER — Encounter: Payer: Self-pay | Admitting: Physical Therapy

## 2019-02-18 ENCOUNTER — Ambulatory Visit: Payer: BC Managed Care – PPO | Admitting: Physical Therapy

## 2019-02-18 DIAGNOSIS — G8929 Other chronic pain: Secondary | ICD-10-CM

## 2019-02-18 DIAGNOSIS — M25552 Pain in left hip: Secondary | ICD-10-CM | POA: Diagnosis not present

## 2019-02-18 DIAGNOSIS — M545 Low back pain, unspecified: Secondary | ICD-10-CM

## 2019-02-18 DIAGNOSIS — M25551 Pain in right hip: Secondary | ICD-10-CM

## 2019-02-18 NOTE — Therapy (Signed)
[facility] [address] [place], [address] Phone: [phone]   Fax:  [phone]  Physical Therapy Treatment  Patient Details  Name: Kathleen Bowen MRN: [id number] Date of Birth: May 07, 1970 No data recorded  Encounter Date: 02/18/2019  PT End of Session - 02/18/19 0919    Visit Number  3    Number of Visits  17    Date for PT Re-Evaluation  04/06/19    PT Start Time  0830    PT Stop Time  0910    PT Time Calculation (min)  40 min    Activity Tolerance  Patient tolerated treatment well    Behavior During Therapy  [facility] for tasks assessed/performed       Past Medical History:  Diagnosis Date  . Heart disease   . Heart murmur   . Hx of PTL (preterm labor), current pregnancy     Past Surgical History:  Procedure Laterality Date  . ASD REPAIR  1992  . BREAST BIOPSY Right 05/14/2015   Benign  . HEMORROIDECTOMY    . MOHS SURGERY     PRECANCEROUS SKIN REMOVED  . TONSILLECTOMY      There were no vitals filed for this visit.  Subjective Assessment - 02/18/19 0831    Subjective  Reports some increased hip mobility, but increased numbness down bilat LEs that is worse. 3/10 hip pain bilat    Pertinent History  Patient is a 48 year old female presenting with bilat hip pain L>R. Is familar with this clinic as she was treated for similar onset of L hip pain last fall. Patinet has noticed progression hip pain over a month. Reports the only thing she has done differently is sleep on her mom's couch where she has been living to take care of her mother (since her mother had COVID in July). Pt is physically assisting in transferring her mother, though she can walk with RW short distances, describes assistance as modA for all transfers. 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 Pain is mostly in L glute with numbess down post LLE to front of the foot that comes with increased L glute  pain. Reports her pain is made worse by crossing her legs, sitting for more than 57mns, and squatting to help her mom or to retrieve items. Pain is better with ibprofen. Worst pain in past week 5/10, best 0/10, currently: 310. Endorses R lateral hip pain that comes on with L sided pain, but does not have a numbess/tingling component. Pt denies N/V, B&B changes, unexplained weight fluctuation, saddle paresthesia, fever, night sweats, or unrelenting night pain at this time.    Limitations  Lifting;Sitting    How long can you sit comfortably?  10-15 min    How long can you stand comfortably?  unlimited    How long can you walk comfortably?  unlimited    Diagnostic tests  None at this time    Patient Stated Goals  Decrease LLE numbess    Pain Onset  More than a month ago         Ther-Ex - Prone prop 165m; prone press x10 = centralization without numbness sensation - Lower trunk rotations x20 - Open book in sidelying x12 3-5sec hold - Squat review with good carry over; with 20# box 2x 10 with good carry over of proper technique in a functional capacity - Standing ext x10 with education on completing standing  or prone press every hour, at least every hour of sitting for work. Education on centralization and limiting forward bending with squat techniques as muc as able    Manual STM with trigger point release to R glute med, lateral focus; L glute max over piriformis/deep rotators Following: Dry Needling: (2/2) 178m .25 needles placed along the R glute med (lateral) and L piriformis/glute max to decrease increased muscular spasms and trigger points with the patient positioned in prone. Patient was educated on risks and benefits of therapy and verbally consents to PT.  L1-S1 G2 mob 30sec bouts 4 bouts each segment for pain reduction; G3 mob for increased motion 30sec bouts 4 bouts each segment                       PT Education - 02/18/19 0919    Education Details  therex  form; TDN; centralization    Person(s) Educated  Patient    Methods  Explanation;Demonstration;Verbal cues    Comprehension  Verbalized understanding;Returned demonstration;Verbal cues required       PT Short Term Goals - 02/09/19 1036      PT SHORT TERM GOAL #1   Title  Pt will be independent with HEP in order to improve strength and balance in order to decrease fall risk and improve function at home and work.    Baseline  02/09/19    Time  4    Period  Weeks    Status  New        PT Long Term Goals - 02/09/19 1454      PT LONG TERM GOAL #1   Title  Pt will increase LEFS by at least 9 points in order to demonstrate significant improvement in lower extremity function.    Baseline  02/09/19 64/80    Time  8    Period  Weeks    Status  New      PT LONG TERM GOAL #2   Title  Pt will decrease worst back pain as reported on NPRS by at least 2 points in order to demonstrate clinically significant reduction in back pain.    Baseline  02/09/19 5/10 with sitting >125ms and squatting/lifting    Time  8    Period  Weeks    Status  New      PT LONG TERM GOAL #3   Title  Pt will increase strength of by at least 1/2 MMT grade in order to demonstrate improvement in strength and function.    Baseline  02/08/09 see eval    Time  8    Period  Weeks    Status  New            Plan - 02/18/19 097939  Clinical Impression Statement  PT continued to utlize manual with TDN with good success, and therex for increased lumbar spine and hip mobility. Patient with good centralization of bilat numbess sensations with repeated ext. Education provided on HEP updates to maintain session gains. with good understanding. Will continue progression as able.    Personal Factors and Comorbidities  Age;Behavior Pattern;Comorbidity 1;Fitness    Comorbidities  obesity    Examination-Activity Limitations  Lift;Squat;Sit;Caring for Others    Examination-Participation Restrictions  Church;Cleaning;Community  Activity    Stability/Clinical Decision Making  Evolving/Moderate complexity    Clinical Decision Making  Moderate    Rehab Potential  Good    Clinical Impairments Affecting Rehab Potential  (+) motivation to pursue more  active lifestyle, fairly young age, social support (-) scheduling    PT Frequency  2x / week    PT Duration  8 weeks    PT Treatment/Interventions  Neuromuscular re-education;Passive range of motion;Manual techniques;Dry needling;Patient/family education;Therapeutic activities;Balance training;Therapeutic exercise;Electrical Stimulation;Cryotherapy;Ultrasound;Traction;Moist Heat;Functional mobility training;Aquatic Therapy;ADLs/Self Care Home Management;Iontophoresis 43m/ml Dexamethasone;Gait training;Taping    PT Next Visit Plan  post hip strengthening/hip mobs and manual for soft tissue restrictions    PT Home Exercise Plan  Sciatic nerve glide, piriformis stretch, gastroc stretch, sidelying hip abd    Consulted and Agree with Plan of Care  Patient       Patient will benefit from skilled therapeutic intervention in order to improve the following deficits and impairments:  Abnormal gait, Increased fascial restricitons, Impaired sensation, Improper body mechanics, Pain, Impaired tone, Postural dysfunction, Increased muscle spasms, Decreased mobility, Decreased activity tolerance, Decreased range of motion, Decreased strength, Difficulty walking, Impaired flexibility, Decreased endurance  Visit Diagnosis: Pain in left hip  Chronic left-sided low back pain without sciatica  Pain in right hip     Problem List Patient Active Problem List   Diagnosis Date Noted  . Cough in adult 05/27/2018  . Overweight (BMI 25.0-29.9) 01/05/2018  . Abnormal mammogram of right breast 10/27/2016  . SK (solar keratosis) 10/27/2016  . Status post device closure of ASD- with [id number]/20/2018  . Chronic sinusitis-  10/27/2016  . Family history of coronary artery disease in brother  10/27/2016  . 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 Family history of malignant melanoma of skin- sis around age 606ish08/20/2018  . Obesity (BMI 30-39.9) 10/27/2016   CShelton SilvasPT, DPT CShelton Silvas12/01/2019, 9:32 AM  [facility] [address] [place] [address]Phone: [phone]  Fax:  [phone] Name: Kathleen ADKISONMRN: [id number] of Birth: 507/11/1970

## 2019-02-22 ENCOUNTER — Other Ambulatory Visit: Payer: Self-pay

## 2019-02-22 ENCOUNTER — Encounter: Payer: Self-pay | Admitting: Physical Therapy

## 2019-02-22 ENCOUNTER — Ambulatory Visit: Payer: BC Managed Care – PPO | Admitting: Physical Therapy

## 2019-02-22 DIAGNOSIS — M25552 Pain in left hip: Secondary | ICD-10-CM

## 2019-02-22 DIAGNOSIS — G8929 Other chronic pain: Secondary | ICD-10-CM

## 2019-02-22 DIAGNOSIS — M25551 Pain in right hip: Secondary | ICD-10-CM

## 2019-02-22 NOTE — Therapy (Signed)
[facility] [address] [place], [address] Phone: [phone]   Fax:  [phone]  Physical Therapy Treatment  Patient Details  Name: Kathleen Bowen MRN: [id number] Date of Birth: 04-Jan-1971 No data recorded  Encounter Date: 02/22/2019  PT End of Session - 02/22/19 1651    Visit Number  4    Number of Visits  17    Date for PT Re-Evaluation  04/06/19    PT Start Time  0345   pt late to session   PT Stop Time  0415    PT Time Calculation (min)  30 min    Activity Tolerance  Patient tolerated treatment well    Behavior During Therapy  [facility] for tasks assessed/performed       Past Medical History:  Diagnosis Date  . Heart disease   . Heart murmur   . Hx of PTL (preterm labor), current pregnancy     Past Surgical History:  Procedure Laterality Date  . ASD REPAIR  1992  . BREAST BIOPSY Right 05/14/2015   Benign  . HEMORROIDECTOMY    . MOHS SURGERY     PRECANCEROUS SKIN REMOVED  . TONSILLECTOMY      There were no vitals filed for this visit.  Subjective Assessment - 02/22/19 1546    Subjective  Reports R hip pain > L hip pain 4/10 today. BLE numbess when sitting for any period of time, subsides with standing and repeated ext    Pertinent History  Patient is a 48 year old female presenting with bilat hip pain L>R. Is familar with this clinic as she was treated for similar onset of L hip pain last fall. Patinet has noticed progression hip pain over a month. Reports the only thing she has done differently is sleep on her mom's couch where she has been living to take care of her mother (since her mother had COVID in July). Pt is physically assisting in transferring her mother, though she can walk with RW short distances, describes assistance as modA for all transfers. 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 Pain is mostly in L glute with numbess down post LLE to front of  the foot that comes with increased L glute pain. Reports her pain is made worse by crossing her legs, sitting for more than 29mns, and squatting to help her mom or to retrieve items. Pain is better with ibprofen. Worst pain in past week 5/10, best 0/10, currently: 310. Endorses R lateral hip pain that comes on with L sided pain, but does not have a numbess/tingling component. Pt denies N/V, B&B changes, unexplained weight fluctuation, saddle paresthesia, fever, night sweats, or unrelenting night pain at this time.    Limitations  Lifting;Sitting    How long can you sit comfortably?  10-15 min    How long can you stand comfortably?  unlimited    How long can you walk comfortably?  unlimited    Diagnostic tests  None at this time    Patient Stated Goals  Decrease LLE numbess          Ther-Ex - Prone alt supermans x10 each - Bird dogs alt 2x 10 with TC to prevent rotation with cone on back, good carry over - SL bridge Prone prop 186m; prone press x10 = centralization without numbness sensation - Lower trunk rotations x20 - Hip hinge x10 with demo and cuing needed for proper technique  with good carry over - Straight leg deadlift 5# DB 2x 10 with good carry over of hip hinge   Manual L1-S1 G3 mob for increased motion 30sec bouts 4 bouts each segment; repeated in prone prop Lumbar traction 10sec traction 10sec relax x10; with rotation x10                       PT Education - 02/22/19 1556    Person(s) Educated  Patient    Methods  Explanation;Demonstration;Verbal cues    Comprehension  Verbalized understanding;Returned demonstration;Verbal cues required       PT Short Term Goals - 02/09/19 1036      PT SHORT TERM GOAL #1   Title  Pt will be independent with HEP in order to improve strength and balance in order to decrease fall risk and improve function at home and work.    Baseline  02/09/19    Time  4    Period  Weeks    Status  New        PT Long Term  Goals - 02/09/19 1454      PT LONG TERM GOAL #1   Title  Pt will increase LEFS by at least 9 points in order to demonstrate significant improvement in lower extremity function.    Baseline  02/09/19 64/80    Time  8    Period  Weeks    Status  New      PT LONG TERM GOAL #2   Title  Pt will decrease worst back pain as reported on NPRS by at least 2 points in order to demonstrate clinically significant reduction in back pain.    Baseline  02/09/19 5/10 with sitting >32mns and squatting/lifting    Time  8    Period  Weeks    Status  New      PT LONG TERM GOAL #3   Title  Pt will increase strength of by at least 1/2 MMT grade in order to demonstrate improvement in strength and function.    Baseline  02/08/09 see eval    Time  8    Period  Weeks    Status  New            Plan - 02/22/19 1705    Clinical Impression Statement  Session shortened d/t patient thinking apointment time was 345. Increased complaints of BLE numbess with sitting for 142mute. Following manual techniques patient able to sit for 2 mins without discomfort or numbness. Patinet is able to complete therex for hip and core strength/stability with good success. PT wil lcontinue progression as able.    Personal Factors and Comorbidities  Age;Behavior Pattern;Comorbidity 1;Fitness    Comorbidities  obesity    Examination-Activity Limitations  Lift;Squat;Sit;Caring for Others    Examination-Participation Restrictions  Church;Cleaning;Community Activity    Stability/Clinical Decision Making  Evolving/Moderate complexity    Clinical Decision Making  Moderate    Rehab Potential  Good    Clinical Impairments Affecting Rehab Potential  (+) motivation to pursue more active lifestyle, fairly young age, social support (-) scheduling    PT Treatment/Interventions  Neuromuscular re-education;Passive range of motion;Manual techniques;Dry needling;Patient/family education;Therapeutic activities;Balance training;Therapeutic  exercise;Electrical Stimulation;Cryotherapy;Ultrasound;Traction;Moist Heat;Functional mobility training;Aquatic Therapy;ADLs/Self Care Home Management;Iontophoresis 57m76ml Dexamethasone;Gait training;Taping    PT Next Visit Plan  post hip strengthening/hip mobs and manual for soft tissue restrictions    PT Home Exercise Plan  Sciatic nerve glide, piriformis stretch, gastroc stretch, sidelying hip abd  Consulted and Agree with Plan of Care  Patient       Patient will benefit from skilled therapeutic intervention in order to improve the following deficits and impairments:  Abnormal gait, Increased fascial restricitons, Impaired sensation, Improper body mechanics, Pain, Impaired tone, Postural dysfunction, Increased muscle spasms, Decreased mobility, Decreased activity tolerance, Decreased range of motion, Decreased strength, Difficulty walking, Impaired flexibility, Decreased endurance  Visit Diagnosis: Pain in left hip  Chronic left-sided low back pain without sciatica  Pain in right hip     Problem List Patient Active Problem List   Diagnosis Date Noted  . Cough in adult 05/27/2018  . Overweight (BMI 25.0-29.9) 01/05/2018  . Abnormal mammogram of right breast 10/27/2016  . SK (solar keratosis) 10/27/2016  . Status post device closure of ASD- with [phone] 10/27/2016  . Chronic sinusitis-  10/27/2016  . Family history of coronary artery disease in brother 10/27/2016  . 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 Family history of malignant melanoma of skin- sis around age 58ish 10/27/2016  . Obesity (BMI 30-39.9) 10/27/2016   Shelton Silvas PT, DPT Shelton Silvas 02/22/2019, 5:09 PM  [place] [facility] [address] [place], [address] Phone: [phone]   Fax:  [phone]  Name: Kathleen Bowen MRN: [id number] Date of Birth: [birth year]

## 2019-02-25 ENCOUNTER — Encounter: Payer: BC Managed Care – PPO | Admitting: Physical Therapy

## 2019-02-28 ENCOUNTER — Encounter: Payer: Self-pay | Admitting: Physical Therapy

## 2019-02-28 ENCOUNTER — Other Ambulatory Visit: Payer: Self-pay

## 2019-02-28 ENCOUNTER — Ambulatory Visit: Payer: BC Managed Care – PPO | Admitting: Physical Therapy

## 2019-02-28 DIAGNOSIS — M25552 Pain in left hip: Secondary | ICD-10-CM | POA: Diagnosis not present

## 2019-02-28 DIAGNOSIS — M25551 Pain in right hip: Secondary | ICD-10-CM

## 2019-02-28 DIAGNOSIS — G8929 Other chronic pain: Secondary | ICD-10-CM

## 2019-02-28 DIAGNOSIS — M545 Low back pain, unspecified: Secondary | ICD-10-CM

## 2019-02-28 NOTE — Therapy (Signed)
[place] [facility] [address] [place], [address] Phone: [phone]   Fax:  [phone]  Physical Therapy Treatment  Patient Details  Name: Kathleen Bowen MRN: [id number] Date of Birth: 07-08-1970 No data recorded  Encounter Date: 02/28/2019  PT End of Session - 02/28/19 1639    Visit Number  5    Number of Visits  17    Date for PT Re-Evaluation  04/06/19    PT Start Time  0400    PT Stop Time  0445    PT Time Calculation (min)  45 min    Activity Tolerance  Patient tolerated treatment well    Behavior During Therapy  [facility] for tasks assessed/performed       Past Medical History:  Diagnosis Date  . Heart disease   . Heart murmur   . Hx of PTL (preterm labor), current pregnancy     Past Surgical History:  Procedure Laterality Date  . ASD REPAIR  1992  . BREAST BIOPSY Right 05/14/2015   Benign  . HEMORROIDECTOMY    . MOHS SURGERY     PRECANCEROUS SKIN REMOVED  . TONSILLECTOMY      There were no vitals filed for this visit.  Subjective Assessment - 02/28/19 1604    Subjective  Reports numbness is better. She is still getting this sometimes with sitting, but was able to sit for entire car rirde to therapy without numbness. Reports 3/10 pain at bilat lateral hips    Pertinent History  Patient is a 48 year old female presenting with bilat hip pain L>R. Is familar with this clinic as she was treated for similar onset of L hip pain last fall. Patinet has noticed progression hip pain over a month. Reports the only thing she has done differently is sleep on her mom's couch where she has been living to take care of her mother (since her mother had COVID in July). Pt is physically assisting in transferring her mother, though she can walk with RW short distances, describes assistance as modA for all transfers. 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 Pain is mostly in L glute with  numbess down post LLE to front of the foot that comes with increased L glute pain. Reports her pain is made worse by crossing her legs, sitting for more than 84mns, and squatting to help her mom or to retrieve items. Pain is better with ibprofen. Worst pain in past week 5/10, best 0/10, currently: 310. Endorses R lateral hip pain that comes on with L sided pain, but does not have a numbess/tingling component. Pt denies N/V, B&B changes, unexplained weight fluctuation, saddle paresthesia, fever, night sweats, or unrelenting night pain at this time.    Limitations  Lifting;Sitting    How long can you sit comfortably?  10-15 min    How long can you stand comfortably?  unlimited    How long can you walk comfortably?  unlimited    Diagnostic tests  None at this time    Patient Stated Goals  Decrease LLE numbess       Ther-Ex -  Single leg deadlift 3x 10 with demo and max cuing initially with modifications needed initially for toe tap in opposite ext to lift with good carry over - [place] squat 3x 10 bilat with demo and max cuing to prevent pushing through back leg with good carry over Education to complete stretching tonight  and tomorrow, without strengthening therex from HEP to prevent overuse injury. Review of repeated ext HEP  Manual L1-S1 G3 mob for increased motion 30sec bouts 4 bouts each segment; repeated in prone prop Lumbar traction with ball 10sec traction 10sec relax x10; with rotation x10  STM with trigger point release to bilat glute musculature R sided focus on glute med and cross friction to ITB and L sided focus on glute max and deep rotators. Following: Dry Needling: (1/1) 16m .30 needles placed along the R glute med and L glute max/piriformis to decrease increased muscular spasms and trigger points with the patient positioned in prone. Patient was educated on risks and benefits of therapy and verbally consents to PT.                           PT  Education - 02/28/19 1639    Education Details  therex form, TDN    Person(s) Educated  Patient    Methods  Explanation;Demonstration;Verbal cues    Comprehension  Verbalized understanding;Returned demonstration;Verbal cues required       PT Short Term Goals - 02/09/19 1036      PT SHORT TERM GOAL #1   Title  Pt will be independent with HEP in order to improve strength and balance in order to decrease fall risk and improve function at home and work.    Baseline  02/09/19    Time  4    Period  Weeks    Status  New        PT Long Term Goals - 02/09/19 1454      PT LONG TERM GOAL #1   Title  Pt will increase LEFS by at least 9 points in order to demonstrate significant improvement in lower extremity function.    Baseline  02/09/19 64/80    Time  8    Period  Weeks    Status  New      PT LONG TERM GOAL #2   Title  Pt will decrease worst back pain as reported on NPRS by at least 2 points in order to demonstrate clinically significant reduction in back pain.    Baseline  02/09/19 5/10 with sitting >128ms and squatting/lifting    Time  8    Period  Weeks    Status  New      PT LONG TERM GOAL #3   Title  Pt will increase strength of by at least 1/2 MMT grade in order to demonstrate improvement in strength and function.    Baseline  02/08/09 see eval    Time  8    Period  Weeks    Status  New            Plan - 02/28/19 1651    Clinical Impression Statement  PT continued to utilize manual techniques for decreased pain and muscle tension with good success. Patient is able to complete SL therex with good balance and carry over of all cuing given for proper technique. PT will continue progression as able.    Personal Factors and Comorbidities  Age;Behavior Pattern;Comorbidity 1;Fitness    Comorbidities  obesity    Examination-Activity Limitations  Lift;Squat;Sit;Caring for Others    Examination-Participation Restrictions  Church;Cleaning;Community Activity     Stability/Clinical Decision Making  Evolving/Moderate complexity    Clinical Decision Making  Moderate    Rehab Potential  Good    Clinical Impairments Affecting Rehab Potential  (+) motivation to pursue  more active lifestyle, fairly young age, social support (-) scheduling    PT Frequency  2x / week    PT Duration  8 weeks    PT Treatment/Interventions  Neuromuscular re-education;Passive range of motion;Manual techniques;Dry needling;Patient/family education;Therapeutic activities;Balance training;Therapeutic exercise;Electrical Stimulation;Cryotherapy;Ultrasound;Traction;Moist Heat;Functional mobility training;Aquatic Therapy;ADLs/Self Care Home Management;Iontophoresis 69m/ml Dexamethasone;Gait training;Taping    PT Next Visit Plan  post hip strengthening/hip mobs and manual for soft tissue restrictions    PT Home Exercise Plan  Sciatic nerve glide, piriformis stretch, gastroc stretch, sidelying hip abd    Consulted and Agree with Plan of Care  Patient       Patient will benefit from skilled therapeutic intervention in order to improve the following deficits and impairments:  Abnormal gait, Increased fascial restricitons, Impaired sensation, Improper body mechanics, Pain, Impaired tone, Postural dysfunction, Increased muscle spasms, Decreased mobility, Decreased activity tolerance, Decreased range of motion, Decreased strength, Difficulty walking, Impaired flexibility, Decreased endurance  Visit Diagnosis: Pain in left hip  Chronic left-sided low back pain without sciatica  Pain in right hip     Problem List Patient Active Problem List   Diagnosis Date Noted  . Cough in adult 05/27/2018  . Overweight (BMI 25.0-29.9) 01/05/2018  . Abnormal mammogram of right breast 10/27/2016  . SK (solar keratosis) 10/27/2016  . Status post device closure of ASD- with [id number]/20/2018  . Chronic sinusitis-  10/27/2016  . Family history of coronary artery disease in brother 10/27/2016  .  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 Family history of malignant melanoma of skin- sis around age 5655ish08/20/2018  . Obesity (BMI 30-39.9) 10/27/2016   CShelton SilvasPT, DPT CShelton Silvas12/21/2020, 4:53 PM  [place] [address] [place] [address]Phone: [phone]  Fax:  [phone] Name: Kathleen HAVEYMRN: [id number] of Birth: 5December 15, 1972

## 2019-03-02 ENCOUNTER — Encounter: Payer: Self-pay | Admitting: Physical Therapy

## 2019-03-02 ENCOUNTER — Other Ambulatory Visit: Payer: Self-pay

## 2019-03-02 ENCOUNTER — Ambulatory Visit: Payer: BC Managed Care – PPO | Admitting: Physical Therapy

## 2019-03-02 DIAGNOSIS — M25552 Pain in left hip: Secondary | ICD-10-CM

## 2019-03-02 DIAGNOSIS — G8929 Other chronic pain: Secondary | ICD-10-CM

## 2019-03-02 DIAGNOSIS — M25551 Pain in right hip: Secondary | ICD-10-CM

## 2019-03-02 NOTE — Therapy (Signed)
[facility] [address] [place], [address] Phone: [phone]   Fax:  [phone]  Physical Therapy Treatment  Patient Details  Name: Kathleen Bowen MRN: [id number] Date of Birth: February 12, 1971 No data recorded  Encounter Date: 03/02/2019  PT End of Session - 03/02/19 1523    Visit Number  6    Number of Visits  17    Date for PT Re-Evaluation  04/06/19    PT Start Time  0315    PT Stop Time  0400    PT Time Calculation (min)  45 min    Activity Tolerance  Patient tolerated treatment well    Behavior During Therapy  [facility] for tasks assessed/performed       Past Medical History:  Diagnosis Date  . Heart disease   . Heart murmur   . Hx of PTL (preterm labor), current pregnancy     Past Surgical History:  Procedure Laterality Date  . ASD REPAIR  1992  . BREAST BIOPSY Right 05/14/2015   Benign  . HEMORROIDECTOMY    . MOHS SURGERY     PRECANCEROUS SKIN REMOVED  . TONSILLECTOMY      There were no vitals filed for this visit.  Subjective Assessment - 03/02/19 1520    Subjective  Patinet reports her LLE is numb down posterior thigh and ant tib, very debilitating. RLE feels good. Minimal back pain. Does report she has been in the car 4 hours today with poor posture    Pertinent History  Patient is a 48 year old female presenting with bilat hip pain L>R. Is familar with this clinic as she was treated for similar onset of L hip pain last fall. Patinet has noticed progression hip pain over a month. Reports the only thing she has done differently is sleep on her mom's couch where she has been living to take care of her mother (since her mother had COVID in July). Pt is physically assisting in transferring her mother, though she can walk with RW short distances, describes assistance as modA for all transfers. 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 Pain is mostly in L glute with  numbess down post LLE to front of the foot that comes with increased L glute pain. Reports her pain is made worse by crossing her legs, sitting for more than 3mns, and squatting to help her mom or to retrieve items. Pain is better with ibprofen. Worst pain in past week 5/10, best 0/10, currently: 310. Endorses R lateral hip pain that comes on with L sided pain, but does not have a numbess/tingling component. Pt denies N/V, B&B changes, unexplained weight fluctuation, saddle paresthesia, fever, night sweats, or unrelenting night pain at this time.    How long can you sit comfortably?  10-15 min    How long can you stand comfortably?  unlimited    How long can you walk comfortably?  unlimited    Diagnostic tests  None at this time    Patient Stated Goals  Decrease LLE numbess    Pain Onset  More than a month ago          Ther-Ex - Prone press with R rotation for L lumbar openning x10-    Sidelying open book x10 bilat 5sec hold - Lower trunk rotations 10sec hold bilat - SKTC 30sec each side - Double knee to chest 30sec - Double knee to chest with side  to side rocking x20  - [place] split squat 3x 10 bilat with demo and max cuing to prevent pushing through back leg with good carry over Education ext medications to ADLs with ext/post pelvic tilt; towel roll in car/prolonged sitting  Manual L1-S1 G3 CPA mob for increased motion 30sec bouts 4 bouts each segment; repeated in prone prop L1-S1 G3 R UPA mob for increased L gapping 30sec bouts 4 bouts each segment; STM with trigger point release to bilat glute musculature R sided focus on glute med and cross friction to ITB and L sided focus on glute max and deep rotators. Following: Dry Needling: (2) 129m .30 needles placed along the L glute max/piriformis to decrease increased muscular spasms and trigger points with the patient positioned in prone. Patient was educated on risks and benefits of therapy and verbally consents to PT.                         PT Education - 03/02/19 1522    Education Details  therex form; TDN    Person(s) Educated  Patient    Methods  Explanation;Demonstration;Verbal cues    Comprehension  Verbalized understanding;Verbal cues required;Returned demonstration       PT Short Term Goals - 02/09/19 1036      PT SHORT TERM GOAL #1   Title  Pt will be independent with HEP in order to improve strength and balance in order to decrease fall risk and improve function at home and work.    Baseline  02/09/19    Time  4    Period  Weeks    Status  New        PT Long Term Goals - 02/09/19 1454      PT LONG TERM GOAL #1   Title  Pt will increase LEFS by at least 9 points in order to demonstrate significant improvement in lower extremity function.    Baseline  02/09/19 64/80    Time  8    Period  Weeks    Status  New      PT LONG TERM GOAL #2   Title  Pt will decrease worst back pain as reported on NPRS by at least 2 points in order to demonstrate clinically significant reduction in back pain.    Baseline  02/09/19 5/10 with sitting >175ms and squatting/lifting    Time  8    Period  Weeks    Status  New      PT LONG TERM GOAL #3   Title  Pt will increase strength of by at least 1/2 MMT grade in order to demonstrate improvement in strength and function.    Baseline  02/08/09 see eval    Time  8    Period  Weeks    Status  New            Plan - 03/02/19 1550    Clinical Impression Statement  PT continued manual techniques with TDN for decreased muscle tension and pain relief with good success. Following manual techniques pt reports no pain/tingling and numbess, which she is very pleased with. Decreased pain allowing for therex progression for poscural control and lumbar/pelvic mobility with good success, some cuing needed. WIll continue progression as needed    Personal Factors and Comorbidities  Age;Behavior Pattern;Comorbidity 1;Fitness    Comorbidities   obesity    Examination-Activity Limitations  Lift;Squat;Sit;Caring for Others    Examination-Participation Restrictions  Church;Cleaning;Community Activity  Stability/Clinical Decision Making  Evolving/Moderate complexity    Clinical Decision Making  Moderate    Rehab Potential  Good    Clinical Impairments Affecting Rehab Potential  (+) motivation to pursue more active lifestyle, fairly young age, social support (-) scheduling    PT Frequency  2x / week    PT Duration  8 weeks    PT Treatment/Interventions  Neuromuscular re-education;Passive range of motion;Manual techniques;Dry needling;Patient/family education;Therapeutic activities;Balance training;Therapeutic exercise;Electrical Stimulation;Cryotherapy;Ultrasound;Traction;Moist Heat;Functional mobility training;Aquatic Therapy;ADLs/Self Care Home Management;Iontophoresis 56m/ml Dexamethasone;Gait training;Taping    PT Next Visit Plan  post hip strengthening/hip mobs and manual for soft tissue restrictions    PT Home Exercise Plan  Sciatic nerve glide, piriformis stretch, gastroc stretch, sidelying hip abd    Consulted and Agree with Plan of Care  Patient       Patient will benefit from skilled therapeutic intervention in order to improve the following deficits and impairments:  Abnormal gait, Increased fascial restricitons, Impaired sensation, Improper body mechanics, Pain, Impaired tone, Postural dysfunction, Increased muscle spasms, Decreased mobility, Decreased activity tolerance, Decreased range of motion, Decreased strength, Difficulty walking, Impaired flexibility, Decreased endurance  Visit Diagnosis: Pain in left hip  Chronic left-sided low back pain without sciatica  Pain in right hip     Problem List Patient Active Problem List   Diagnosis Date Noted  . Cough in adult 05/27/2018  . Overweight (BMI 25.0-29.9) 01/05/2018  . Abnormal mammogram of right breast 10/27/2016  . SK (solar keratosis) 10/27/2016  . Status  post device closure of ASD- with [phone]/20/2018  . Chronic sinusitis-  10/27/2016  . Family history of coronary artery disease in brother 10/27/2016  . 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 Family history of malignant melanoma of skin- sis around age 446ish08/20/2018  . Obesity (BMI 30-39.9) 10/27/2016   CShelton SilvasPT, DPT CShelton Silvas12/23/2020, 3:58 PM  [facility] [address] [place] [address]Phone: [phone]  Fax:  [phone] Name: KSHAWNNA PANCAKEMRN: [id number] of Birth: [birth year]

## 2019-03-07 ENCOUNTER — Encounter: Payer: Self-pay | Admitting: Physical Therapy

## 2019-03-07 ENCOUNTER — Ambulatory Visit: Payer: BC Managed Care – PPO | Admitting: Physical Therapy

## 2019-03-07 ENCOUNTER — Other Ambulatory Visit: Payer: Self-pay

## 2019-03-07 DIAGNOSIS — M25551 Pain in right hip: Secondary | ICD-10-CM

## 2019-03-07 DIAGNOSIS — M25552 Pain in left hip: Secondary | ICD-10-CM | POA: Diagnosis not present

## 2019-03-07 DIAGNOSIS — G8929 Other chronic pain: Secondary | ICD-10-CM

## 2019-03-07 NOTE — Therapy (Signed)
[facility] [address], [place], [address] Phone: [phone]   Fax:  [phone]  Physical Therapy Treatment  Patient Details  Name: Kathleen Bowen MRN: [id number] Date of Birth: 1970-09-22 No data recorded  Encounter Date: 03/07/2019  PT End of Session - 03/07/19 1538    Visit Number  7    Number of Visits  17    Date for PT Re-Evaluation  04/06/19    PT Start Time  0319    PT Stop Time  0358    PT Time Calculation (min)  39 min    Activity Tolerance  Patient tolerated treatment well    Behavior During Therapy  [facility] for tasks assessed/performed       Past Medical History:  Diagnosis Date  . Heart disease   . Heart murmur   . Hx of PTL (preterm labor), current pregnancy     Past Surgical History:  Procedure Laterality Date  . ASD REPAIR  1992  . BREAST BIOPSY Right 05/14/2015   Benign  . HEMORROIDECTOMY    . MOHS SURGERY     PRECANCEROUS SKIN REMOVED  . TONSILLECTOMY      There were no vitals filed for this visit.  Subjective Assessment - 03/07/19 1518    Subjective  Reports LBP and numbness have subsided with lumbar support modifications. Does report she had some numbess Saturday after being up on her feet on hardwoods. Does report R hip 5/10 pain, no L hip pain.    Pertinent History  Patient is a 48 year old female presenting with bilat hip pain L>R. Is familar with this clinic as she was treated for similar onset of L hip pain last fall. Patinet has noticed progression hip pain over a month. Reports the only thing she has done differently is sleep on her mom's couch where she has been living to take care of her mother (since her mother had COVID in July). Pt is physically assisting in transferring her mother, though she can walk with RW short distances, describes assistance as modA for all transfers. 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 Pain is mostly in  L glute with numbess down post LLE to front of the foot that comes with increased L glute pain. Reports her pain is made worse by crossing her legs, sitting for more than 62mns, and squatting to help her mom or to retrieve items. Pain is better with ibprofen. Worst pain in past week 5/10, best 0/10, currently: 310. Endorses R lateral hip pain that comes on with L sided pain, but does not have a numbess/tingling component. Pt denies N/V, B&B changes, unexplained weight fluctuation, saddle paresthesia, fever, night sweats, or unrelenting night pain at this time.    Limitations  Lifting;Sitting    How long can you sit comfortably?  10-15 min    How long can you stand comfortably?  unlimited    How long can you walk comfortably?  unlimited    Diagnostic tests  None at this time    Patient Stated Goals  Decrease LLE numbess    Pain Onset  More than a month ago       Ther-Ex - Alt supermans 2x 10 cuing needed initially for proper technique with good carry over - Full supermans 2x 10 - Cat/Cow with breath control x10  - Thread the needle x6 each side with cuing for breath control  - SL  bridge 2x 10 bilat with min cuing for full hip ext with good carry over  - Forward lunge to step back onto bosu (ard side)  2x 8 bilat with demo and max cuing initially for proper technique with good carry over following  Manual L1-S1 G3 CPA mob for increased motion 30sec bouts 4 bouts each segment; repeated in prone prop L1-S1 G3 R UPA mob for increased L gapping 30sec bouts 4 bouts each segment; STM withtrigger point releaseto R glute musculature focus on glute med and cross friction to ITB. Following:Dry Needling: (2)191m .30needles placed along the R glute +-to decrease increased muscular spasms and trigger points with the patient positioned in prone. Patient was educated on risks and benefits of therapy and verbally consents to PT.                         PT Education - 03/07/19  1523    Education Details  therex form, TDN    Person(s) Educated  Patient    Methods  Explanation;Demonstration;Verbal cues    Comprehension  Verbalized understanding;Returned demonstration;Verbal cues required       PT Short Term Goals - 02/09/19 1036      PT SHORT TERM GOAL #1   Title  Pt will be independent with HEP in order to improve strength and balance in order to decrease fall risk and improve function at home and work.    Baseline  02/09/19    Time  4    Period  Weeks    Status  New        PT Long Term Goals - 02/09/19 1454      PT LONG TERM GOAL #1   Title  Pt will increase LEFS by at least 9 points in order to demonstrate significant improvement in lower extremity function.    Baseline  02/09/19 64/80    Time  8    Period  Weeks    Status  New      PT LONG TERM GOAL #2   Title  Pt will decrease worst back pain as reported on NPRS by at least 2 points in order to demonstrate clinically significant reduction in back pain.    Baseline  02/09/19 5/10 with sitting >155ms and squatting/lifting    Time  8    Period  Weeks    Status  New      PT LONG TERM GOAL #3   Title  Pt will increase strength of by at least 1/2 MMT grade in order to demonstrate improvement in strength and function.    Baseline  02/08/09 see eval    Time  8    Period  Weeks    Status  New            Plan - 03/07/19 1607    Clinical Impression Statement  PT continued manual techniques with TDN to small area of pain this session with continued success. PT continued therex for increased spine mobility and core/hip strengthening with good success. Patient requires some cuing but is able to comply with all cues for proper technique. PT will continue progression as able.    Personal Factors and Comorbidities  Age;Behavior Pattern;Comorbidity 1;Fitness    Comorbidities  obesity    Examination-Activity Limitations  Lift;Squat;Sit;Caring for Others    Examination-Participation Restrictions   Church;Cleaning;Community Activity    Stability/Clinical Decision Making  Evolving/Moderate complexity    Clinical Decision Making  Moderate    Rehab  Potential  Good    Clinical Impairments Affecting Rehab Potential  (+) motivation to pursue more active lifestyle, fairly young age, social support (-) scheduling    PT Frequency  2x / week    PT Duration  8 weeks    PT Treatment/Interventions  Neuromuscular re-education;Passive range of motion;Manual techniques;Dry needling;Patient/family education;Therapeutic activities;Balance training;Therapeutic exercise;Electrical Stimulation;Cryotherapy;Ultrasound;Traction;Moist Heat;Functional mobility training;Aquatic Therapy;ADLs/Self Care Home Management;Iontophoresis 46m/ml Dexamethasone;Gait training;Taping    PT Next Visit Plan  post hip strengthening/hip mobs and manual for soft tissue restrictions    PT Home Exercise Plan  Sciatic nerve glide, piriformis stretch, gastroc stretch, sidelying hip abd    Consulted and Agree with Plan of Care  Patient       Patient will benefit from skilled therapeutic intervention in order to improve the following deficits and impairments:  Abnormal gait, Increased fascial restricitons, Impaired sensation, Improper body mechanics, Pain, Impaired tone, Postural dysfunction, Increased muscle spasms, Decreased mobility, Decreased activity tolerance, Decreased range of motion, Decreased strength, Difficulty walking, Impaired flexibility, Decreased endurance  Visit Diagnosis: Pain in left hip  Chronic left-sided low back pain without sciatica  Pain in right hip     Problem List Patient Active Problem List   Diagnosis Date Noted  . Cough in adult 05/27/2018  . Overweight (BMI 25.0-29.9) 01/05/2018  . Abnormal mammogram of right breast 10/27/2016  . SK (solar keratosis) 10/27/2016  . Status post device closure of ASD- with [id number]/20/2018  . Chronic sinusitis-  10/27/2016  . Family history of coronary artery  disease in brother 10/27/2016  . 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 Family history of malignant melanoma of skin- sis around age 7955ish08/20/2018  . Obesity (BMI 30-39.9) 10/27/2016   CShelton SilvasPT, DPT CShelton Silvas12/28/2020, 4:12 PM  [facility] [address] [place] [address]Phone: [phone]  Fax:  [phone] Name: Kathleen GASNERMRN: [id number] of Birth: [birth year]

## 2019-03-09 ENCOUNTER — Ambulatory Visit: Payer: BC Managed Care – PPO | Admitting: Physical Therapy

## 2019-03-09 ENCOUNTER — Encounter: Payer: Self-pay | Admitting: Physical Therapy

## 2019-03-09 ENCOUNTER — Other Ambulatory Visit: Payer: Self-pay

## 2019-03-09 DIAGNOSIS — M25551 Pain in right hip: Secondary | ICD-10-CM

## 2019-03-09 DIAGNOSIS — M25552 Pain in left hip: Secondary | ICD-10-CM | POA: Diagnosis not present

## 2019-03-09 DIAGNOSIS — G8929 Other chronic pain: Secondary | ICD-10-CM

## 2019-03-09 NOTE — Therapy (Signed)
[facility] [address] [place], [address] Phone: [phone]   Fax:  [phone]  Physical Therapy Treatment  Patient Details  Name: Kathleen Bowen MRN: [id number] Date of Birth: 01-Apr-1970 No data recorded  Encounter Date: 03/09/2019  PT End of Session - 03/09/19 1638    Visit Number  8    Number of Visits  17    Date for PT Re-Evaluation  04/06/19    PT Start Time  0412    PT Stop Time  0450    PT Time Calculation (min)  38 min    Activity Tolerance  Patient tolerated treatment well    Behavior During Therapy  [facility] for tasks assessed/performed       Past Medical History:  Diagnosis Date  . Heart disease   . Heart murmur   . Hx of PTL (preterm labor), current pregnancy     Past Surgical History:  Procedure Laterality Date  . ASD REPAIR  1992  . BREAST BIOPSY Right 05/14/2015   Benign  . HEMORROIDECTOMY    . MOHS SURGERY     PRECANCEROUS SKIN REMOVED  . TONSILLECTOMY      There were no vitals filed for this visit.  Subjective Assessment - 03/09/19 1615    Subjective  Reports numbness has completely subsided, and so has her back pain which she is happy with. Reports point tenderness to R lateral hip, reports this pain is 4/10    Pertinent History  Patient is a 48 year old female presenting with bilat hip pain L>R. Is familar with this clinic as she was treated for similar onset of L hip pain last fall. Patinet has noticed progression hip pain over a month. Reports the only thing she has done differently is sleep on her mom's couch where she has been living to take care of her mother (since her mother had COVID in July). Pt is physically assisting in transferring her mother, though she can walk with RW short distances, describes assistance as modA for all transfers. 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 Pain is mostly in L glute with numbess down post LLE to  front of the foot that comes with increased L glute pain. Reports her pain is made worse by crossing her legs, sitting for more than 40mns, and squatting to help her mom or to retrieve items. Pain is better with ibprofen. Worst pain in past week 5/10, best 0/10, currently: 310. Endorses R lateral hip pain that comes on with L sided pain, but does not have a numbess/tingling component. Pt denies N/V, B&B changes, unexplained weight fluctuation, saddle paresthesia, fever, night sweats, or unrelenting night pain at this time.    Limitations  Lifting;Sitting    How long can you sit comfortably?  10-15 min    How long can you stand comfortably?  unlimited    How long can you walk comfortably?  unlimited    Diagnostic tests  None at this time    Patient Stated Goals  Decrease LLE numbess    Pain Onset  More than a month ago       Ther-Ex -Alt supermans 2x 10 cuing needed initially for proper technique with good carry over - Bird dog 2x 10 (each LE/UE lift) with min cuing initially for core contraction with good carry over following - Forward lunge to step back onto bosu (ard side)  2x 8 bilat with  demo and max cuing initially for proper technique with good carry over following Education on sleep hygiene with pillow between knees and ice for bursitis inflammation, to lay off hip abd therex over the weekend.   Manual STM withtrigger point releaseto R glute musculature focus on glute med and cross friction to ITB. Lacrosse ball used to aid lateral glute musculature massage as well Manual obers stretch 10x 10sec hold; 41mn on last stretch Following:Dry Needling: (2)1070m.30needles placed along the R glute to decrease increased muscular spasms and trigger points with the patient positioned in prone. Patient was educated on risks and benefits of therapy and verbally consents to PT.                        PT Education - 03/09/19 1617    Education Details  therex form, TDN     Person(s) Educated  Patient    Methods  Explanation;Demonstration;Verbal cues;Tactile cues    Comprehension  Verbalized understanding;Returned demonstration;Verbal cues required;Tactile cues required       PT Short Term Goals - 02/09/19 1036      PT SHORT TERM GOAL #1   Title  Pt will be independent with HEP in order to improve strength and balance in order to decrease fall risk and improve function at home and work.    Baseline  02/09/19    Time  4    Period  Weeks    Status  New        PT Long Term Goals - 02/09/19 1454      PT LONG TERM GOAL #1   Title  Pt will increase LEFS by at least 9 points in order to demonstrate significant improvement in lower extremity function.    Baseline  02/09/19 64/80    Time  8    Period  Weeks    Status  New      PT LONG TERM GOAL #2   Title  Pt will decrease worst back pain as reported on NPRS by at least 2 points in order to demonstrate clinically significant reduction in back pain.    Baseline  02/09/19 5/10 with sitting >1067m and squatting/lifting    Time  8    Period  Weeks    Status  New      PT LONG TERM GOAL #3   Title  Pt will increase strength of by at least 1/2 MMT grade in order to demonstrate improvement in strength and function.    Baseline  02/08/09 see eval    Time  8    Period  Weeks    Status  New            Plan - 03/09/19 1652    Clinical Impression Statement  PT continued manual techniques with TDN for localized pain this session. Educated paitent on sleep hygiene with pillow between knees to prevent R hip adduction with good verbalized understanding (patient reports increased pain following sleeping on L side). Good carry over of technique with therex with min cuing needed.    Personal Factors and Comorbidities  Age;Behavior Pattern;Comorbidity 1;Fitness    Comorbidities  obesity    Examination-Activity Limitations  Lift;Squat;Sit;Caring for Others    Examination-Participation Restrictions   Church;Cleaning;Community Activity    Stability/Clinical Decision Making  Evolving/Moderate complexity    Clinical Decision Making  Moderate    Rehab Potential  Good    Clinical Impairments Affecting Rehab Potential  (+) motivation to pursue more active  lifestyle, fairly young age, social support (-) scheduling    PT Frequency  2x / week    PT Duration  8 weeks    PT Treatment/Interventions  Neuromuscular re-education;Passive range of motion;Manual techniques;Dry needling;Patient/family education;Therapeutic activities;Balance training;Therapeutic exercise;Electrical Stimulation;Cryotherapy;Ultrasound;Traction;Moist Heat;Functional mobility training;Aquatic Therapy;ADLs/Self Care Home Management;Iontophoresis 48m/ml Dexamethasone;Gait training;Taping    PT Next Visit Plan  post hip strengthening/hip mobs and manual for soft tissue restrictions    PT Home Exercise Plan  Sciatic nerve glide, piriformis stretch, gastroc stretch, sidelying hip abd    Consulted and Agree with Plan of Care  Patient       Patient will benefit from skilled therapeutic intervention in order to improve the following deficits and impairments:  Abnormal gait, Increased fascial restricitons, Impaired sensation, Improper body mechanics, Pain, Impaired tone, Postural dysfunction, Increased muscle spasms, Decreased mobility, Decreased activity tolerance, Decreased range of motion, Decreased strength, Difficulty walking, Impaired flexibility, Decreased endurance  Visit Diagnosis: Pain in left hip  Chronic left-sided low back pain without sciatica  Pain in right hip     Problem List Patient Active Problem List   Diagnosis Date Noted  . Cough in adult 05/27/2018  . Overweight (BMI 25.0-29.9) 01/05/2018  . Abnormal mammogram of right breast 10/27/2016  . SK (solar keratosis) 10/27/2016  . Status post device closure of ASD- with [phone]/20/2018  . Chronic sinusitis-  10/27/2016  . Family history of coronary artery  disease in brother 10/27/2016  . 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 Family history of malignant melanoma of skin- sis around age 7056ish08/20/2018  . Obesity (BMI 30-39.9) 10/27/2016   CShelton SilvasPT, DPT CShelton Silvas12/30/2020, 5:02 PM  [facility] [address] [place] [address]Phone: [phone]  Fax:  [phone] Name: Kathleen PLANCKMRN: [id number] of Birth: [birth year]

## 2019-03-14 ENCOUNTER — Encounter: Payer: Self-pay | Admitting: Physical Therapy

## 2019-03-14 ENCOUNTER — Ambulatory Visit: Payer: BC Managed Care – PPO | Attending: Family Medicine | Admitting: Physical Therapy

## 2019-03-14 ENCOUNTER — Other Ambulatory Visit: Payer: Self-pay

## 2019-03-14 DIAGNOSIS — M25552 Pain in left hip: Secondary | ICD-10-CM | POA: Diagnosis present

## 2019-03-14 DIAGNOSIS — M545 Low back pain, unspecified: Secondary | ICD-10-CM

## 2019-03-14 DIAGNOSIS — G8929 Other chronic pain: Secondary | ICD-10-CM | POA: Insufficient documentation

## 2019-03-14 DIAGNOSIS — M25551 Pain in right hip: Secondary | ICD-10-CM | POA: Insufficient documentation

## 2019-03-14 NOTE — Therapy (Signed)
[facility] [address], [place], [address] Phone: [phone]   Fax:  [phone]  Physical Therapy Treatment  Patient Details  Name: Kathleen Bowen MRN: [id number] Date of Birth: January 04, 1971 No data recorded  Encounter Date: 03/14/2019  PT End of Session - 03/14/19 1510    Visit Number  9    Number of Visits  17    Date for PT Re-Evaluation  04/06/19    PT Start Time  0236    PT Stop Time  0315    PT Time Calculation (min)  39 min    Activity Tolerance  Patient tolerated treatment well    Behavior During Therapy  [facility] for tasks assessed/performed       Past Medical History:  Diagnosis Date  . Heart disease   . Heart murmur   . Hx of PTL (preterm labor), current pregnancy     Past Surgical History:  Procedure Laterality Date  . ASD REPAIR  1992  . BREAST BIOPSY Right 05/14/2015   Benign  . HEMORROIDECTOMY    . MOHS SURGERY     PRECANCEROUS SKIN REMOVED  . TONSILLECTOMY      There were no vitals filed for this visit.  Subjective Assessment - 03/14/19 1437    Subjective  Patient continues to report no numbness or tingling bilat. No pain to L hip. R hip pain 5/10.    Pertinent History  Patient is a 49 year old female presenting with bilat hip pain L>R. Is familar with this clinic as she was treated for similar onset of L hip pain last fall. Patinet has noticed progression hip pain over a month. Reports the only thing she has done differently is sleep on her mom's couch where she has been living to take care of her mother (since her mother had COVID in July). Pt is physically assisting in transferring her mother, though she can walk with RW short distances, describes assistance as modA for all transfers. 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 Pain is mostly in L glute with numbess down post LLE to front of the foot that comes with increased L glute pain. Reports her  pain is made worse by crossing her legs, sitting for more than 39mns, and squatting to help her mom or to retrieve items. Pain is better with ibprofen. Worst pain in past week 5/10, best 0/10, currently: 310. Endorses R lateral hip pain that comes on with L sided pain, but does not have a numbess/tingling component. Pt denies N/V, B&B changes, unexplained weight fluctuation, saddle paresthesia, fever, night sweats, or unrelenting night pain at this time.    Limitations  Lifting;Sitting    How long can you sit comfortably?  10-15 min    How long can you stand comfortably?  unlimited    How long can you walk comfortably?  unlimited    Diagnostic tests  None at this time    Patient Stated Goals  Decrease LLE numbess    Pain Onset  More than a month ago      Ther-Ex - Side plank with alt clamshell 2x 10 bilatwith TC for positioning with decent carry over - Supine SLR circles 2x 10 clockwise/counterclockwise - SL deadlift BW x10 bilat; with 10# DB x10 bilat with cuing initially for hip hinge without lower lumbar rotation with good carry over Alt supermans 2x 10 cuing needed initially for proper technique with good  carry over - [place] split squat 2x 10 bilat with good carry over of minimal correction  Manual STM withtrigger point releasetoRglute musculature focus on glute med and cross friction to ITB. Lacrosse ball used to aid lateral glute musculature massage as well Manual obers stretch 10x 10sec hold; 41mn on last stretch Following:Dry Needling: (2)1024m.30needles placed along theR glute to decrease increased muscular spasms and trigger points with the patient positioned in prone. Patient was educated on risks and benefits of therapy and verbally consents to PT.                          PT Education - 03/14/19 1455    Education Details  therex form, TDN    Person(s) Educated  Patient    Methods  Explanation;Demonstration;Verbal cues    Comprehension   Verbalized understanding;Returned demonstration;Verbal cues required       PT Short Term Goals - 02/09/19 1036      PT SHORT TERM GOAL #1   Title  Pt will be independent with HEP in order to improve strength and balance in order to decrease fall risk and improve function at home and work.    Baseline  02/09/19    Time  4    Period  Weeks    Status  New        PT Long Term Goals - 02/09/19 1454      PT LONG TERM GOAL #1   Title  Pt will increase LEFS by at least 9 points in order to demonstrate significant improvement in lower extremity function.    Baseline  02/09/19 64/80    Time  8    Period  Weeks    Status  New      PT LONG TERM GOAL #2   Title  Pt will decrease worst back pain as reported on NPRS by at least 2 points in order to demonstrate clinically significant reduction in back pain.    Baseline  02/09/19 5/10 with sitting >107m and squatting/lifting    Time  8    Period  Weeks    Status  New      PT LONG TERM GOAL #3   Title  Pt will increase strength of by at least 1/2 MMT grade in order to demonstrate improvement in strength and function.    Baseline  02/08/09 see eval    Time  8    Period  Weeks    Status  New            Plan - 03/14/19 1515    Clinical Impression Statement  PT continued TDN with manual techniques for R glute tension with good success. patient is making improvements in pain, localizing to R piriformis/glute med without numbness/tingling , which is an improvement. Patient with no pain with therex, and good compliance of all cuing for proper technique. PT will continue progression as able.    Personal Factors and Comorbidities  Age;Behavior Pattern;Comorbidity 1;Fitness    Comorbidities  obesity    Examination-Activity Limitations  Lift;Squat;Sit;Caring for Others    Examination-Participation Restrictions  Church;Cleaning;Community Activity    Stability/Clinical Decision Making  Evolving/Moderate complexity    Clinical Decision Making   Moderate    Rehab Potential  Good    Clinical Impairments Affecting Rehab Potential  (+) motivation to pursue more active lifestyle, fairly young age, social support (-) scheduling    PT Frequency  2x / week    PT  Duration  8 weeks    PT Treatment/Interventions  Neuromuscular re-education;Passive range of motion;Manual techniques;Dry needling;Patient/family education;Therapeutic activities;Balance training;Therapeutic exercise;Electrical Stimulation;Cryotherapy;Ultrasound;Traction;Moist Heat;Functional mobility training;Aquatic Therapy;ADLs/Self Care Home Management;Iontophoresis 43m/ml Dexamethasone;Gait training;Taping    PT Next Visit Plan  post hip strengthening/hip mobs and manual for soft tissue restrictions    PT Home Exercise Plan  Sciatic nerve glide, piriformis stretch, gastroc stretch, sidelying hip abd    Consulted and Agree with Plan of Care  Patient       Patient will benefit from skilled therapeutic intervention in order to improve the following deficits and impairments:  Abnormal gait, Increased fascial restricitons, Impaired sensation, Improper body mechanics, Pain, Impaired tone, Postural dysfunction, Increased muscle spasms, Decreased mobility, Decreased activity tolerance, Decreased range of motion, Decreased strength, Difficulty walking, Impaired flexibility, Decreased endurance  Visit Diagnosis: Pain in left hip  Chronic left-sided low back pain without sciatica  Pain in right hip     Problem List Patient Active Problem List   Diagnosis Date Noted  . Cough in adult 05/27/2018  . Overweight (BMI 25.0-29.9) 01/05/2018  . Abnormal mammogram of right breast 10/27/2016  . SK (solar keratosis) 10/27/2016  . Status post device closure of ASD- with [id number]/20/2018  . Chronic sinusitis-  10/27/2016  . Family history of coronary artery disease in brother 10/27/2016  . 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 Family history of malignant melanoma of skin- sis around  age 1182ish08/20/2018  . Obesity (BMI 30-39.9) 10/27/2016   CShelton SilvasPT, DPT CShelton Silvas1/06/2019, 4:02 PM  [facility] [facility] [address] [place] [address]Phone: [phone]  Fax:  [phone] Name: Kathleen RIEDESELMRN: [id number] of Birth: [birth year]

## 2019-03-16 ENCOUNTER — Encounter: Payer: Self-pay | Admitting: Physical Therapy

## 2019-03-16 ENCOUNTER — Other Ambulatory Visit: Payer: Self-pay

## 2019-03-16 ENCOUNTER — Ambulatory Visit: Payer: BC Managed Care – PPO | Admitting: Physical Therapy

## 2019-03-16 DIAGNOSIS — G8929 Other chronic pain: Secondary | ICD-10-CM

## 2019-03-16 DIAGNOSIS — M25552 Pain in left hip: Secondary | ICD-10-CM | POA: Diagnosis not present

## 2019-03-16 DIAGNOSIS — M25551 Pain in right hip: Secondary | ICD-10-CM

## 2019-03-16 NOTE — Therapy (Signed)
[facility] [address] [place], [address] Phone: [phone]   Fax:  [phone]  Physical Therapy Treatment  Patient Details  Name: Kathleen Bowen MRN: [id number] Date of Birth: 1970-08-18 No data recorded  Encounter Date: 03/16/2019  PT End of Session - 03/16/19 1441    Visit Number  10    Number of Visits  17    Date for PT Re-Evaluation  04/06/19    PT Start Time  0233    PT Stop Time  0312    PT Time Calculation (min)  39 min    Activity Tolerance  Patient tolerated treatment well    Behavior During Therapy  [facility] for tasks assessed/performed       Past Medical History:  Diagnosis Date  . Heart disease   . Heart murmur   . Hx of PTL (preterm labor), current pregnancy     Past Surgical History:  Procedure Laterality Date  . ASD REPAIR  1992  . BREAST BIOPSY Right 05/14/2015   Benign  . HEMORROIDECTOMY    . MOHS SURGERY     PRECANCEROUS SKIN REMOVED  . TONSILLECTOMY      There were no vitals filed for this visit.  Subjective Assessment - 03/16/19 1437    Subjective  Patient reports she has been icing regularly and has no pain since. She is feeling good today overall and is pleased.    Pertinent History  Patient is a 49 year old female presenting with bilat hip pain L>R. Is familar with this clinic as she was treated for similar onset of L hip pain last fall. Patinet has noticed progression hip pain over a month. Reports the only thing she has done differently is sleep on her mom's couch where she has been living to take care of her mother (since her mother had COVID in July). Pt is physically assisting in transferring her mother, though she can walk with RW short distances, describes assistance as modA for all transfers. 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 Pain is mostly in L glute with numbess down post LLE to front of the foot that comes with increased L  glute pain. Reports her pain is made worse by crossing her legs, sitting for more than 59mns, and squatting to help her mom or to retrieve items. Pain is better with ibprofen. Worst pain in past week 5/10, best 0/10, currently: 310. Endorses R lateral hip pain that comes on with L sided pain, but does not have a numbess/tingling component. Pt denies N/V, B&B changes, unexplained weight fluctuation, saddle paresthesia, fever, night sweats, or unrelenting night pain at this time.    Limitations  Lifting;Sitting    How long can you sit comfortably?  10-15 min    How long can you stand comfortably?  unlimited    How long can you walk comfortably?  unlimited    Diagnostic tests  None at this time    Patient Stated Goals  Decrease LLE numbess    Currently in Pain?  No/denies       Ther-Ex - Nustep seat 7 no UE, L4, 4 mins, cuing to maintain SPM over 60 with good carry over - [place] squat 3x 10 bilat with bilat 5# DB at sides and cuing for upright trunk and core contraction with addition of weight with good carry over - SL squat with TRX unilateral support 3x 8 each LE  with increased lateral instability on RLE, able to correct with cuing - bridge on theraball with rollout > hamstring curl 2x 8 with cuing initially for eccentric control with good carry over following - Lateral lunge 2x 10 each LE with demo and heavy cuing through fist set with good carry over in second set    Following session ice pack 45mns unbilled                      PT Education - 03/16/19 1440    Education Details  therex form    Person(s) Educated  Patient    Methods  Explanation;Demonstration;Verbal cues;Tactile cues    Comprehension  Verbalized understanding;Returned demonstration;Verbal cues required;Tactile cues required       PT Short Term Goals - 02/09/19 1036      PT SHORT TERM GOAL #1   Title  Pt will be independent with HEP in order to improve strength and balance in order to  decrease fall risk and improve function at home and work.    Baseline  02/09/19    Time  4    Period  Weeks    Status  New        PT Long Term Goals - 02/09/19 1454      PT LONG TERM GOAL #1   Title  Pt will increase LEFS by at least 9 points in order to demonstrate significant improvement in lower extremity function.    Baseline  02/09/19 64/80    Time  8    Period  Weeks    Status  New      PT LONG TERM GOAL #2   Title  Pt will decrease worst back pain as reported on NPRS by at least 2 points in order to demonstrate clinically significant reduction in back pain.    Baseline  02/09/19 5/10 with sitting >137ms and squatting/lifting    Time  8    Period  Weeks    Status  New      PT LONG TERM GOAL #3   Title  Pt will increase strength of by at least 1/2 MMT grade in order to demonstrate improvement in strength and function.    Baseline  02/08/09 see eval    Time  8    Period  Weeks    Status  New            Plan - 03/16/19 1453    Clinical Impression Statement  PT forewent manual techniques in lieu of pain this session to determine pain response without passive techniques. PT increased resistance and difficulty of therex for hip and core strengthening with good success. Patient requires rest breaks between each set for recovery, and is able to comply with all cuing for proper therex technique and strengthening. PT will continue progression as able.    Personal Factors and Comorbidities  Age;Behavior Pattern;Comorbidity 1;Fitness    Comorbidities  obesity    Examination-Activity Limitations  Lift;Squat;Sit;Caring for Others    Examination-Participation Restrictions  Church;Cleaning;Community Activity    Stability/Clinical Decision Making  Evolving/Moderate complexity    Clinical Decision Making  Moderate    Rehab Potential  Good    Clinical Impairments Affecting Rehab Potential  (+) motivation to pursue more active lifestyle, fairly young age, social support (-) scheduling     PT Frequency  2x / week    PT Duration  8 weeks    PT Treatment/Interventions  Neuromuscular re-education;Passive range of motion;Manual techniques;Dry needling;Patient/family education;Therapeutic  activities;Balance training;Therapeutic exercise;Electrical Stimulation;Cryotherapy;Ultrasound;Traction;Moist Heat;Functional mobility training;Aquatic Therapy;ADLs/Self Care Home Management;Iontophoresis 82m/ml Dexamethasone;Gait training;Taping    PT Next Visit Plan  post hip strengthening/hip mobs and manual for soft tissue restrictions    PT Home Exercise Plan  Sciatic nerve glide, piriformis stretch, gastroc stretch, sidelying hip abd    Consulted and Agree with Plan of Care  Patient       Patient will benefit from skilled therapeutic intervention in order to improve the following deficits and impairments:  Abnormal gait, Increased fascial restricitons, Impaired sensation, Improper body mechanics, Pain, Impaired tone, Postural dysfunction, Increased muscle spasms, Decreased mobility, Decreased activity tolerance, Decreased range of motion, Decreased strength, Difficulty walking, Impaired flexibility, Decreased endurance  Visit Diagnosis: Pain in left hip  Chronic left-sided low back pain without sciatica  Pain in right hip     Problem List Patient Active Problem List   Diagnosis Date Noted  . Cough in adult 05/27/2018  . Overweight (BMI 25.0-29.9) 01/05/2018  . Abnormal mammogram of right breast 10/27/2016  . SK (solar keratosis) 10/27/2016  . Status post device closure of ASD- with [phone]/20/2018  . Chronic sinusitis-  10/27/2016  . Family history of coronary artery disease in brother 10/27/2016  . 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 Family history of malignant melanoma of skin- sis around age 8259ish08/20/2018  . Obesity (BMI 30-39.9) 10/27/2016   CShelton SilvasPT, DPT CShelton Silvas1/08/2019, 3:14 PM  [place] [facility] [address] [place] [address]Phone: [phone]  Fax:  [phone] Name: Kathleen ANDYMRN: [id number] of Birth: 509/20/1972

## 2019-03-21 ENCOUNTER — Ambulatory Visit: Payer: BC Managed Care – PPO | Admitting: Physical Therapy

## 2019-03-21 ENCOUNTER — Encounter: Payer: Self-pay | Admitting: Physical Therapy

## 2019-03-21 ENCOUNTER — Other Ambulatory Visit: Payer: Self-pay

## 2019-03-21 DIAGNOSIS — G8929 Other chronic pain: Secondary | ICD-10-CM

## 2019-03-21 DIAGNOSIS — M25552 Pain in left hip: Secondary | ICD-10-CM

## 2019-03-21 DIAGNOSIS — M25551 Pain in right hip: Secondary | ICD-10-CM

## 2019-03-21 DIAGNOSIS — M545 Low back pain, unspecified: Secondary | ICD-10-CM

## 2019-03-21 NOTE — Therapy (Addendum)
[facility] [address] [place], [address] Phone: [phone]   Fax:  [phone]  Physical Therapy Treatment  Patient Details  Name: Kathleen Bowen MRN: [id number] Date of Birth: [birth year] No data recorded  Encounter Date: 03/21/2019  PT End of Session - 03/22/19 0936    Visit Number  11    Number of Visits  17    Date for PT Re-Evaluation  04/06/19    PT Start Time  0315    PT Stop Time  0400    PT Time Calculation (min)  45 min    Activity Tolerance  Patient tolerated treatment well    Behavior During Therapy  [facility] for tasks assessed/performed       Past Medical History:  Diagnosis Date  . Heart disease   . Heart murmur   . Hx of PTL (preterm labor), current pregnancy     Past Surgical History:  Procedure Laterality Date  . ASD REPAIR  1992  . BREAST BIOPSY Right 05/14/2015   Benign  . HEMORROIDECTOMY    . MOHS SURGERY     PRECANCEROUS SKIN REMOVED  . TONSILLECTOMY      There were no vitals filed for this visit.  Subjective Assessment - 03/21/19 1525    Subjective  Pt reports no pain today and ice for hip relieves pain. Pt has some soreness from last session. Pt is happy with progress being made.    Pertinent History  Patient is a 49 year old female presenting with bilat hip pain L>R. Is familar with this clinic as she was treated for similar onset of L hip pain last fall. Patinet has noticed progression hip pain over a month. Reports the only thing she has done differently is sleep on her mom's couch where she has been living to take care of her mother (since her mother had COVID in July). Pt is physically assisting in transferring her mother, though she can walk with RW short distances, describes assistance as modA for all transfers. 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 Pain is mostly in L glute with numbess down post LLE to front of the foot that comes  with increased L glute pain. Reports her pain is made worse by crossing her legs, sitting for more than 79mns, and squatting to help her mom or to retrieve items. Pain is better with ibprofen. Worst pain in past week 5/10, best 0/10, currently: 310. Endorses R lateral hip pain that comes on with L sided pain, but does not have a numbess/tingling component. Pt denies N/V, B&B changes, unexplained weight fluctuation, saddle paresthesia, fever, night sweats, or unrelenting night pain at this time.    Limitations  Lifting;Sitting    How long can you sit comfortably?  10-15 min    How long can you stand comfortably?  unlimited    How long can you walk comfortably?  unlimited    Diagnostic tests  None at this time    Patient Stated Goals  Decrease LLE numbess         THEREX NuStep 5 minutes  Rear foot elevated split squat 1x10 ea leg; 1x10 10 lbs Squat jumps on TOTALGYM 3x10 at level 26 Side plank with bent leg and clam shell x10 each side; with YTB 2x 10 SL RDL x10 each LE with good carry over following cuing for hip flexion Standing Quad stretch bilat x30 sec  PT Education - 03/22/19 0936    Education Details  therex form    Person(s) Educated  Patient    Methods  Explanation;Demonstration;Verbal cues    Comprehension  Verbalized understanding;Returned demonstration;Verbal cues required       PT Short Term Goals - 02/09/19 1036      PT SHORT TERM GOAL #1   Title  Pt will be independent with HEP in order to improve strength and balance in order to decrease fall risk and improve function at home and work.    Baseline  02/09/19    Time  4    Period  Weeks    Status  New        PT Long Term Goals - 02/09/19 1454      PT LONG TERM GOAL #1   Title  Pt will increase LEFS by at least 9 points in order to demonstrate significant improvement in lower extremity function.    Baseline  02/09/19 64/80    Time  8    Period  Weeks    Status  New       PT LONG TERM GOAL #2   Title  Pt will decrease worst back pain as reported on NPRS by at least 2 points in order to demonstrate clinically significant reduction in back pain.    Baseline  02/09/19 5/10 with sitting >38mns and squatting/lifting    Time  8    Period  Weeks    Status  New      PT LONG TERM GOAL #3   Title  Pt will increase strength of by at least 1/2 MMT grade in order to demonstrate improvement in strength and function.    Baseline  02/08/09 see eval    Time  8    Period  Weeks    Status  New            Plan - 03/21/19 1604    Clinical Impression Statement  Pt demonstrates good carryover from last session with proper technique for strength activities. PT incorporated dynamic jumping exercises and increased difficulty of strength therex for hip and core. Pt responded well to increased load and reports no pain with activity. PT plans to discharge next session if pt remains pain-free.    Personal Factors and Comorbidities  Age;Behavior Pattern;Comorbidity 1;Fitness    Comorbidities  obesity    Examination-Activity Limitations  Lift;Squat;Sit;Caring for Others    Examination-Participation Restrictions  Church;Cleaning;Community Activity    Stability/Clinical Decision Making  Evolving/Moderate complexity    Clinical Decision Making  Moderate    Rehab Potential  Good    Clinical Impairments Affecting Rehab Potential  (+) motivation to pursue more active lifestyle, fairly young age, social support (-) scheduling    PT Frequency  2x / week    PT Duration  8 weeks    PT Treatment/Interventions  Neuromuscular re-education;Passive range of motion;Manual techniques;Dry needling;Patient/family education;Therapeutic activities;Balance training;Therapeutic exercise;Electrical Stimulation;Cryotherapy;Ultrasound;Traction;Moist Heat;Functional mobility training;Aquatic Therapy;ADLs/Self Care Home Management;Iontophoresis 417mml Dexamethasone;Gait training;Taping    PT Next Visit Plan   hip and core strengthening    PT Home Exercise Plan  Sciatic nerve glide, piriformis stretch, gastroc stretch, sidelying hip abd    Consulted and Agree with Plan of Care  Patient       Patient will benefit from skilled therapeutic intervention in order to improve the following deficits and impairments:  Abnormal gait, Increased fascial restricitons, Impaired sensation, Improper body mechanics, Pain, Impaired tone, Postural dysfunction, Increased muscle spasms, Decreased mobility,  Decreased activity tolerance, Decreased range of motion, Decreased strength, Difficulty walking, Impaired flexibility, Decreased endurance  Visit Diagnosis: Pain in left hip  Chronic left-sided low back pain without sciatica  Pain in right hip     Problem List Patient Active Problem List   Diagnosis Date Noted  . Cough in adult 05/27/2018  . Overweight (BMI 25.0-29.9) 01/05/2018  . Abnormal mammogram of right breast 10/27/2016  . SK (solar keratosis) 10/27/2016  . Status post device closure of ASD- with [phone] 10/27/2016  . Chronic sinusitis-  10/27/2016  . Family history of coronary artery disease in brother 10/27/2016  . 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 Family history of malignant melanoma of skin- sis around age 39ish 10/27/2016  . Obesity (BMI 30-39.9) 10/27/2016     Shelton Silvas PT, DPT Ivin Booty 03/22/2019, 9:37 AM  [facility] [address], [place], [address] Phone: [phone]   Fax:  [phone]  Name: Kathleen Bowen MRN: [id number] Date of Birth: 12-06-1970

## 2019-03-23 ENCOUNTER — Other Ambulatory Visit: Payer: Self-pay

## 2019-03-23 ENCOUNTER — Ambulatory Visit: Payer: BC Managed Care – PPO | Admitting: Physical Therapy

## 2019-03-23 ENCOUNTER — Encounter: Payer: Self-pay | Admitting: Physical Therapy

## 2019-03-23 DIAGNOSIS — M545 Low back pain, unspecified: Secondary | ICD-10-CM

## 2019-03-23 DIAGNOSIS — M25552 Pain in left hip: Secondary | ICD-10-CM

## 2019-03-23 DIAGNOSIS — M25551 Pain in right hip: Secondary | ICD-10-CM

## 2019-03-23 DIAGNOSIS — G8929 Other chronic pain: Secondary | ICD-10-CM

## 2019-03-23 NOTE — Therapy (Signed)
[facility] [address], [place], [address] Phone: [phone]   Fax:  [phone]  Physical Therapy Treatment/DC Summary Reporting Period 02/09/19 - 03/23/19  Patient Details  Name: Kathleen Bowen MRN: [id number] Date of Birth: 07-Aug-1970 No data recorded  Encounter Date: 03/23/2019  PT End of Session - 03/23/19 1552    Visit Number  12    Number of Visits  17    Date for PT Re-Evaluation  04/06/19    PT Start Time  0318    PT Stop Time  0346    PT Time Calculation (min)  28 min    Activity Tolerance  Patient tolerated treatment well    Behavior During Therapy  [facility] for tasks assessed/performed       Past Medical History:  Diagnosis Date  . Heart disease   . Heart murmur   . Hx of PTL (preterm labor), current pregnancy     Past Surgical History:  Procedure Laterality Date  . ASD REPAIR  1992  . BREAST BIOPSY Right 05/14/2015   Benign  . HEMORROIDECTOMY    . MOHS SURGERY     PRECANCEROUS SKIN REMOVED  . TONSILLECTOMY      There were no vitals filed for this visit.  Subjective Assessment - 03/23/19 1520    Subjective  No pain today, patient reports she is ready to discharge.    Pertinent History  Patient is a 49 year old female presenting with bilat hip pain L>R. Is familar with this clinic as she was treated for similar onset of L hip pain last fall. Patinet has noticed progression hip pain over a month. Reports the only thing she has done differently is sleep on her mom's couch where she has been living to take care of her mother (since her mother had COVID in July). Pt is physically assisting in transferring her mother, though she can walk with RW short distances, describes assistance as modA for all transfers. 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 Pain is mostly in L glute with numbess down post LLE to front of the foot that comes with increased L glute pain.  Reports her pain is made worse by crossing her legs, sitting for more than 28mns, and squatting to help her mom or to retrieve items. Pain is better with ibprofen. Worst pain in past week 5/10, best 0/10, currently: 310. Endorses R lateral hip pain that comes on with L sided pain, but does not have a numbess/tingling component. Pt denies N/V, B&B changes, unexplained weight fluctuation, saddle paresthesia, fever, night sweats, or unrelenting night pain at this time.    Limitations  Lifting;Sitting    How long can you sit comfortably?  10-15 min    How long can you stand comfortably?  unlimited    How long can you walk comfortably?  unlimited    Diagnostic tests  None at this time    Patient Stated Goals  Decrease LLE numbess    Pain Onset  More than a month ago         Ther-Ex Nustep L4 461ms seat setting 7 with good maintenance of SPM above 75 PT educated patient on frequency, rep set range, and how/when to  Decrease/increase intensity as needed with strengthening HEP below. Review of stretching and pain management techniques with good understanding Exercises  Sidelying Hip Abduction - 10 reps - 3 sets - 1x daily - 2-3x  weekly  Alternating Single Leg Bridge - 10 reps - 3 sets - 1x daily - 2-3x weekly  Single Leg Lunge with Foot on Bench - 10 reps - 3 sets - 1x daily - 2-3x weekly  Lateral Lunge - 10 reps - 3 sets - 1x daily - 2-3x weekly  Side Plank with Clam and Resistance - 10 reps - 3 sets - 1x daily - 2-3x weekly                         PT Education - 03/23/19 1552    Education Details  therex form, HEP, DC recommendations    Person(s) Educated  Patient    Methods  Explanation;Demonstration;Handout    Comprehension  Verbalized understanding;Returned demonstration       PT Short Term Goals - 03/23/19 1520      PT SHORT TERM GOAL #1   Title  Pt will be independent with HEP in order to improve strength and balance in order to decrease fall risk and improve  function at home and work.    Baseline  03/23/19 Compliance with HEP    Time  4    Period  Weeks    Status  Achieved        PT Long Term Goals - 03/23/19 1521      PT LONG TERM GOAL #1   Title  Pt will increase LEFS by at least 9 points in order to demonstrate significant improvement in lower extremity function.    Baseline  03/23/19 70/80    Time  8    Period  Weeks    Status  Achieved      PT LONG TERM GOAL #2   Title  Pt will decrease worst back pain as reported on NPRS by at least 2 points in order to demonstrate clinically significant reduction in back pain.    Baseline  03/23/19 No pain over the past week, 1/10 with heavy activities    Time  8    Period  Weeks    Status  Achieved      PT LONG TERM GOAL #3   Title  Pt will increase strength of by at least 1/2 MMT grade in order to demonstrate improvement in strength and function.    Baseline  03/23/19 5/5 gross bilat    Time  8    Period  Weeks    Status  Achieved            Plan - 03/23/19 1552    Clinical Impression Statement  PT reassessed goals this date, where patient has met all goals to safely DC PT. Patient is able to demonstrate and verbalize understanding of all DC recommendations and HEP. Pt given clinic contact info should any further questions/concerns arise.    Personal Factors and Comorbidities  Age;Behavior Pattern;Comorbidity 1;Fitness    Comorbidities  obesity    Examination-Activity Limitations  Lift;Squat;Sit;Caring for Others    Examination-Participation Restrictions  Church;Cleaning;Community Activity    Stability/Clinical Decision Making  Evolving/Moderate complexity    Clinical Decision Making  Moderate    Rehab Potential  Good    Clinical Impairments Affecting Rehab Potential  (+) motivation to pursue more active lifestyle, fairly young age, social support (-) scheduling    PT Frequency  2x / week    PT Duration  8 weeks    PT Treatment/Interventions  Neuromuscular re-education;Passive  range of motion;Manual techniques;Dry needling;Patient/family education;Therapeutic activities;Balance training;Therapeutic exercise;Electrical Stimulation;Cryotherapy;Ultrasound;Traction;Moist Heat;Functional mobility  training;Aquatic Therapy;ADLs/Self Care Home Management;Iontophoresis 22m/ml Dexamethasone;Gait training;Taping    PT Next Visit Plan  hip and core strengthening    PT Home Exercise Plan  Sciatic nerve glide, piriformis stretch, gastroc stretch, sidelying hip abd    Consulted and Agree with Plan of Care  Patient       Patient will benefit from skilled therapeutic intervention in order to improve the following deficits and impairments:  Abnormal gait, Increased fascial restricitons, Impaired sensation, Improper body mechanics, Pain, Impaired tone, Postural dysfunction, Increased muscle spasms, Decreased mobility, Decreased activity tolerance, Decreased range of motion, Decreased strength, Difficulty walking, Impaired flexibility, Decreased endurance  Visit Diagnosis: Pain in left hip  Chronic left-sided low back pain without sciatica  Pain in right hip     Problem List Patient Active Problem List   Diagnosis Date Noted  . Cough in adult 05/27/2018  . Overweight (BMI 25.0-29.9) 01/05/2018  . Abnormal mammogram of right breast 10/27/2016  . SK (solar keratosis) 10/27/2016  . Status post device closure of ASD- with [phone]/20/2018  . Chronic sinusitis-  10/27/2016  . Family history of coronary artery disease in brother 10/27/2016  . 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 Family history of malignant melanoma of skin- sis around age 2866ish08/20/2018  . Obesity (BMI 30-39.9) 10/27/2016   CShelton SilvasPT, DPT CShelton Silvas1/13/2021, 3:54 PM  [facility] [address] [place] [address]Phone: [phone]  Fax:  [phone] Name: KDORRENE BENTLYMRN: [id number] of Birth:  [birth year]

## 2019-03-28 ENCOUNTER — Encounter: Payer: BC Managed Care – PPO | Admitting: Physical Therapy

## 2019-03-30 ENCOUNTER — Ambulatory Visit: Payer: BC Managed Care – PPO | Admitting: Physical Therapy

## 2019-04-04 ENCOUNTER — Ambulatory Visit: Payer: BC Managed Care – PPO | Admitting: Physical Therapy

## 2019-04-06 ENCOUNTER — Ambulatory Visit: Payer: BC Managed Care – PPO | Admitting: Physical Therapy

## 2019-12-24 ENCOUNTER — Other Ambulatory Visit: Payer: Self-pay

## 2019-12-24 ENCOUNTER — Ambulatory Visit: Payer: BC Managed Care – PPO | Attending: Internal Medicine

## 2019-12-24 DIAGNOSIS — Z23 Encounter for immunization: Secondary | ICD-10-CM

## 2019-12-24 NOTE — Progress Notes (Signed)
   Covid-19 Vaccination Clinic  Name:  Kathleen Bowen    MRN: [id number] DOB: 1971/02/18  12/24/2019  Kathleen Bowen was observed post Covid-19 immunization for 15 minutes without incident. She was provided with Vaccine Information Sheet and instruction to access the V-Safe system.   Kathleen Bowen was instructed to call 911 with any severe reactions post vaccine: Marland Kitchen Difficulty breathing  . Swelling of face and throat  . A fast heartbeat  . A bad rash all over body  . Dizziness and weakness

## 2020-01-02 ENCOUNTER — Other Ambulatory Visit: Payer: Self-pay | Admitting: Obstetrics

## 2020-01-02 DIAGNOSIS — Z1231 Encounter for screening mammogram for malignant neoplasm of breast: Secondary | ICD-10-CM

## 2020-01-24 ENCOUNTER — Ambulatory Visit (INDEPENDENT_AMBULATORY_CARE_PROVIDER_SITE_OTHER): Payer: BC Managed Care – PPO | Admitting: Otolaryngology

## 2020-01-24 ENCOUNTER — Other Ambulatory Visit: Payer: Self-pay

## 2020-01-24 ENCOUNTER — Encounter (INDEPENDENT_AMBULATORY_CARE_PROVIDER_SITE_OTHER): Payer: Self-pay | Admitting: Otolaryngology

## 2020-01-24 VITALS — Temp 97.7°F

## 2020-01-24 DIAGNOSIS — H6123 Impacted cerumen, bilateral: Secondary | ICD-10-CM | POA: Diagnosis not present

## 2020-01-24 NOTE — Progress Notes (Signed)
HPI: Kathleen Bowen is a 49 y.o. female who returns today for evaluation of ear and sinus complaints.  She complains mostly of blockage of her ears and fullness in the ears.  She has also complained of "sinus pressure" but no mucopurulent discharge from her nose.  No fever.  She has previously been treated with Augmentin..  She has had previous CT scan of the sinuses 4 years ago that showed mostly maxillary sinus disease. She has used Q-tips in her ears.  Past Medical History:  Diagnosis Date  . Heart disease   . Heart murmur   . Hx of PTL (preterm labor), current pregnancy    Past Surgical History:  Procedure Laterality Date  . ASD REPAIR  1992  . BREAST BIOPSY Right 05/14/2015   Benign  . HEMORROIDECTOMY    . MOHS SURGERY     PRECANCEROUS SKIN REMOVED  . TONSILLECTOMY     Social History   Socioeconomic History  . Marital status: Married    Spouse name: Not on file  . Number of children: Not on file  . Years of education: Not on file  . Highest education level: Not on file  Occupational History  . Not on file  Tobacco Use  . Smoking status: Never Smoker  . Smokeless tobacco: Never Used  Vaping Use  . Vaping Use: Never used  Substance and Sexual Activity  . Alcohol use: No  . Drug use: No  . Sexual activity: Yes    Birth control/protection: Pill    Comment: LO ESTRIN 24    Other Topics Concern  . Not on file  Social History Narrative  . Not on file   Social Determinants of Health   Financial Resource Strain:   . Difficulty of Paying Living Expenses: Not on file  Food Insecurity:   . Worried About Charity fundraiser in the Last Year: Not on file  . Ran Out of Food in the Last Year: Not on file  Transportation Needs:   . Lack of Transportation (Medical): Not on file  . Lack of Transportation (Non-Medical): Not on file  Physical Activity:   . Days of Exercise per Week: Not on file  . Minutes of Exercise per Session: Not on file  Stress:   . Feeling of  Stress : Not on file  Social Connections:   . Frequency of Communication with Friends and Family: Not on file  . Frequency of Social Gatherings with Friends and Family: Not on file  . Attends Religious Services: Not on file  . Active Member of Clubs or Organizations: Not on file  . Attends Archivist Meetings: Not on file  . Marital Status: Not on file   Family History  Problem Relation Age of Onset  . Skin cancer Sister   . Liver cancer Paternal Grandfather   . Alcoholism Paternal Grandfather   . Heart disease Mother        PACE MAKER  . Skin cancer Mother        ON NOSE  . Atrial fibrillation Mother   . Thrombophlebitis Daughter   . ADD / ADHD Daughter   . Hypertension Father   . Diabetes Paternal Grandmother   . Heart attack Maternal Grandmother   . Heart attack Brother   . ADD / ADHD Son    No Known Allergies Prior to Admission medications   Medication Sig Start Date End Date Taking? Authorizing Provider  albuterol (PROVENTIL HFA;VENTOLIN HFA) 108 (90 Base) MCG/ACT  inhaler Inhale 2 puffs into the lungs every 6 (six) hours as needed for wheezing or shortness of breath. Patient not taking: Reported on 02/09/2019 05/27/18   Mina Marble D, NP  amoxicillin-clavulanate (AUGMENTIN) 875-125 MG tablet Take 1 tablet by mouth 2 (two) times daily. Patient not taking: Reported on 01/24/2020 05/23/18   [provider]  norethindrone-ethinyl estradiol (JUNEL FE,GILDESS FE,LOESTRIN FE) 1-20 MG-MCG tablet Take 1 tablet by mouth daily.    [provider]  predniSONE (DELTASONE) 20 MG tablet 1 tab very 12 hrs for 3 days, then 1 tab daily for 3 days Patient not taking: Reported on 02/09/2019 05/27/18   Mina Marble D, NP     Positive ROS: Otherwise negative  All other systems have been reviewed and were otherwise negative with the exception of those mentioned in the HPI and as above.  Physical Exam: Constitutional: Alert, well-appearing, no acute distress Ears:  External ears without lesions or tenderness.  Both ear canals are completely occluded with cerumen.  This was cleaned with a suction.  TMs were otherwise clear. Nasal: External nose without lesions.  Mild rhinitis with no acute mucopurulent discharge noted. Oral: Lips and gums without lesions. Tongue and palate mucosa without lesions. Posterior oropharynx clear. Neck: No palpable adenopathy or masses Respiratory: Breathing comfortably  Skin: No facial/neck lesions or rash noted.  Cerumen impaction removal  Date/Time: 01/24/2020 5:02 PM Performed by: Rozetta Nunnery, MD Authorized by: Rozetta Nunnery, MD   Consent:    Consent obtained:  Verbal   Consent given by:  Patient   Risks discussed:  Pain and bleeding Procedure details:    Location:  L ear and R ear   Procedure type: curette and suction   Post-procedure details:    Inspection:  TM intact and canal normal   Hearing quality:  Improved   Patient tolerance of procedure:  Tolerated well, no immediate complications Comments:     Both TMs were occluded with cerumen.  After cleaning the wax TMs were clear bilaterally.    Assessment: Bilateral cerumen impactions  Plan: Ear canals were cleaned in the office today and ears felt much better. Recommend use of nasal steroid sprays for nasal congestion. Will follow-up if sinus symptoms persist   Radene Journey, MD

## 2020-02-14 ENCOUNTER — Other Ambulatory Visit: Payer: Self-pay

## 2020-02-14 ENCOUNTER — Ambulatory Visit
Admission: RE | Admit: 2020-02-14 | Discharge: 2020-02-14 | Disposition: A | Discharge status: Home / Self Care | Payer: BC Managed Care – PPO | Source: Ambulatory Visit | Attending: Obstetrics | Admitting: Obstetrics

## 2020-02-14 DIAGNOSIS — Z1231 Encounter for screening mammogram for malignant neoplasm of breast: Secondary | ICD-10-CM

## 2021-02-28 LAB — COLOGUARD: COLOGUARD: NEGATIVE

## 2022-02-27 IMAGING — MG DIGITAL SCREENING BILAT W/ TOMO W/ CAD
6 of 10 series · 6 of 30 positions shown · non-contrast
Comparison: Previous exam(s).

CLINICAL DATA: Screening.

EXAM:
DIGITAL SCREENING BILATERAL MAMMOGRAM WITH TOMO AND CAD

[R MLO synth-2D]
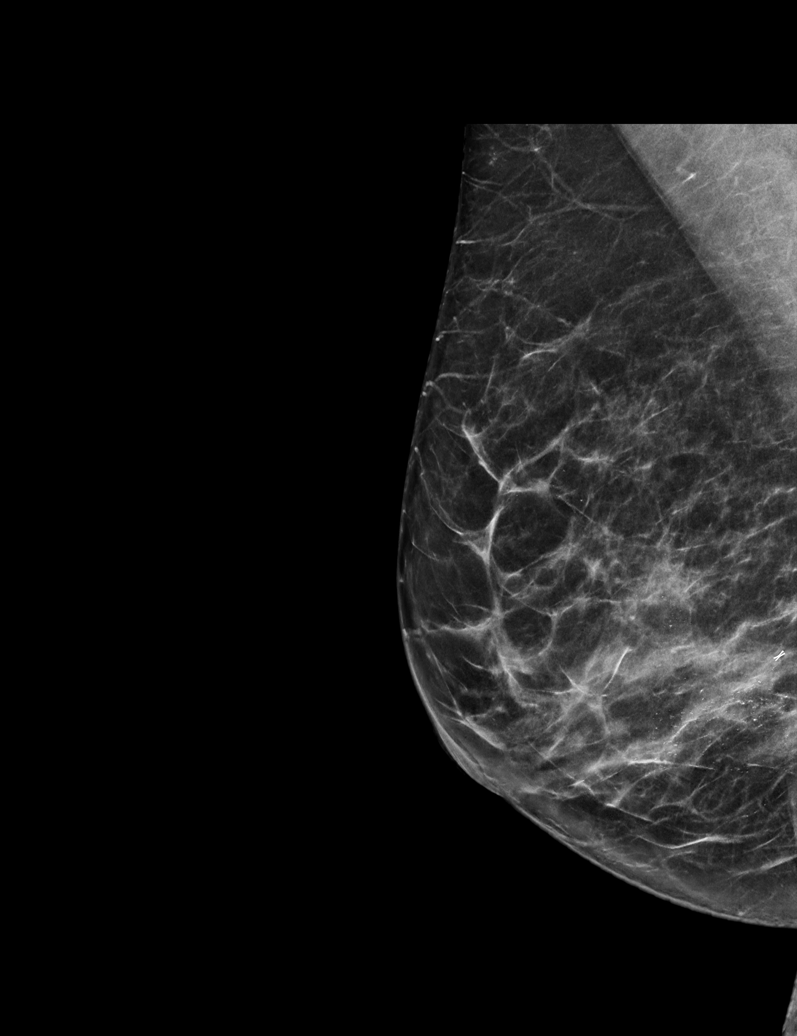

[L MLO synth-2D]
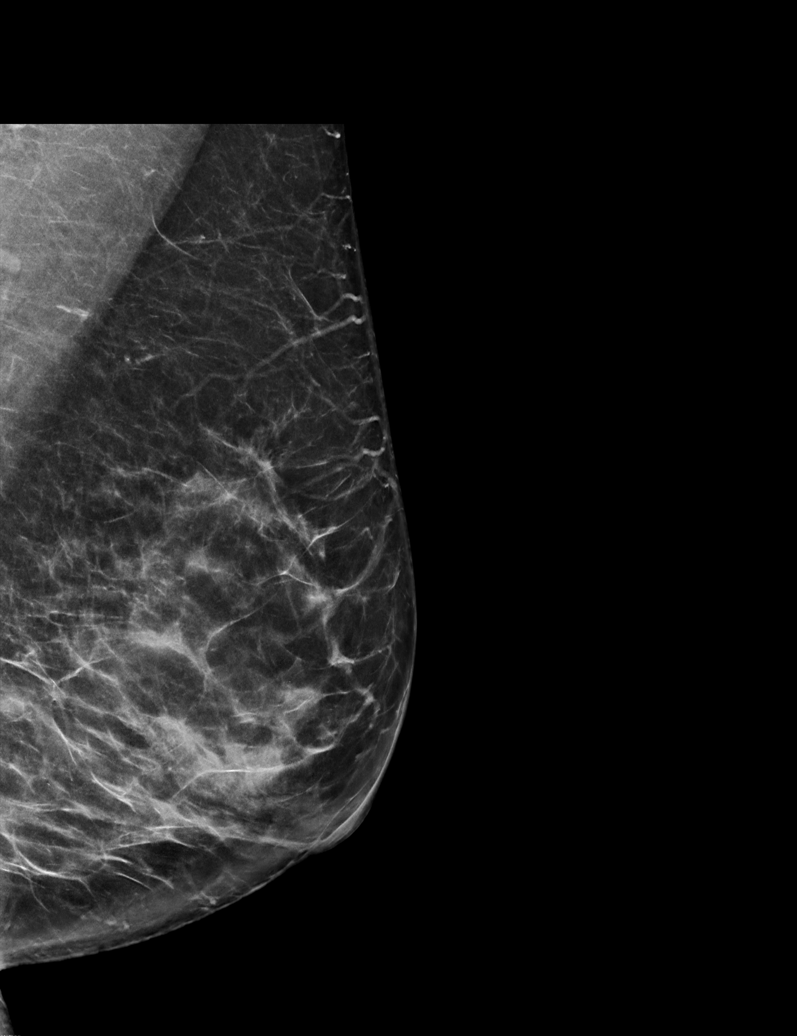

[R CC synth-2D]
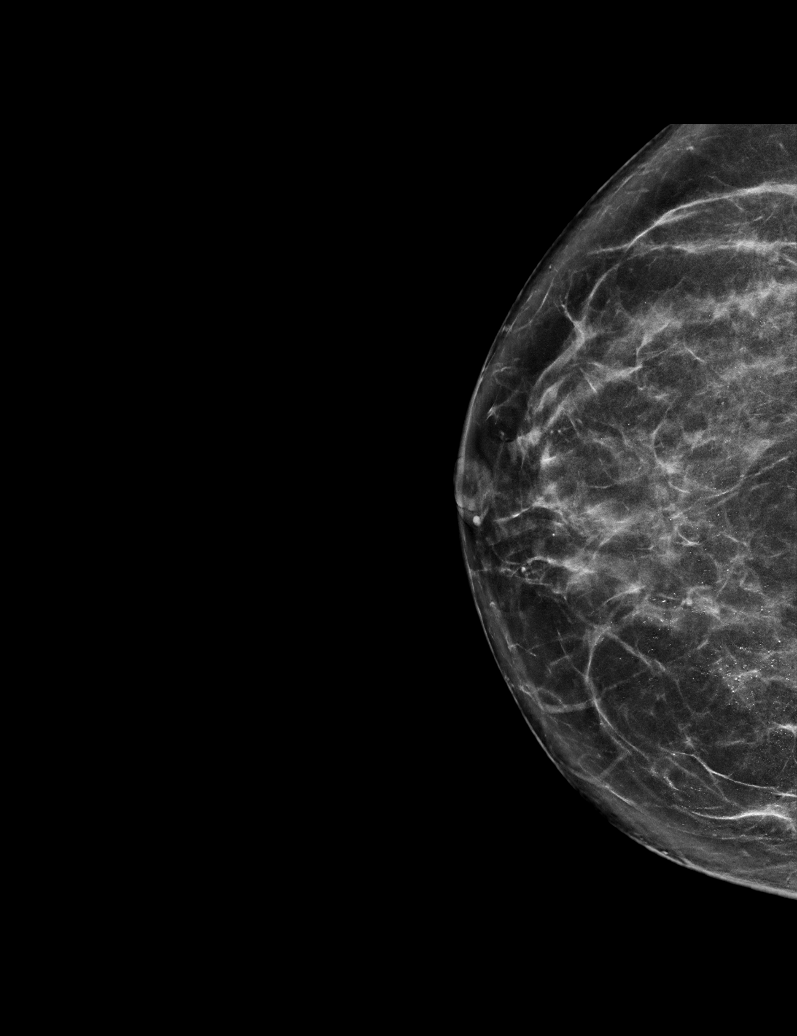

[L CC synth-2D (1 of 2)]
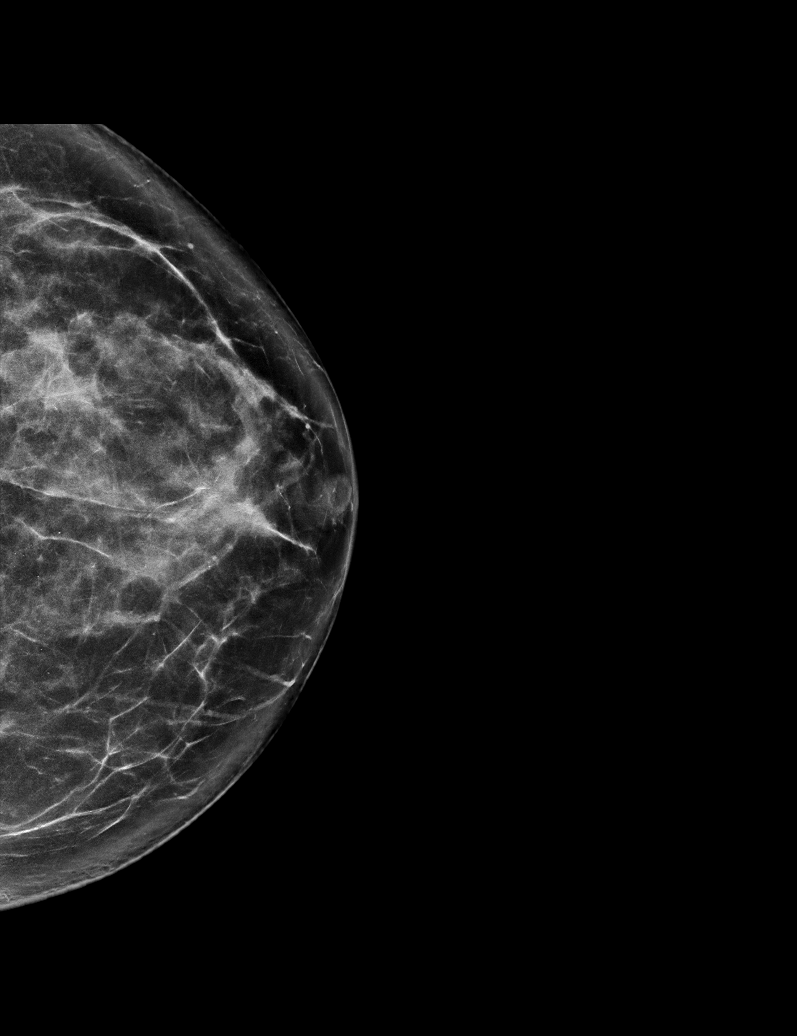

[L CC synth-2D (2 of 2)]
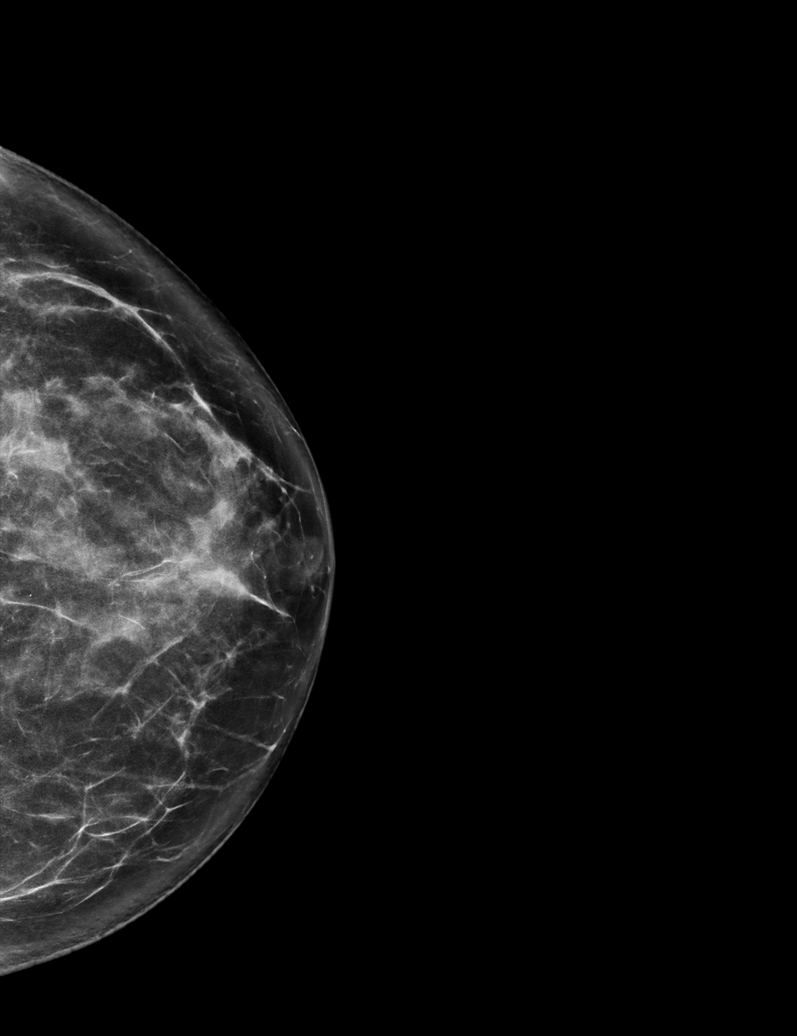

[L CC tomo · tomo slice 35/69.0]
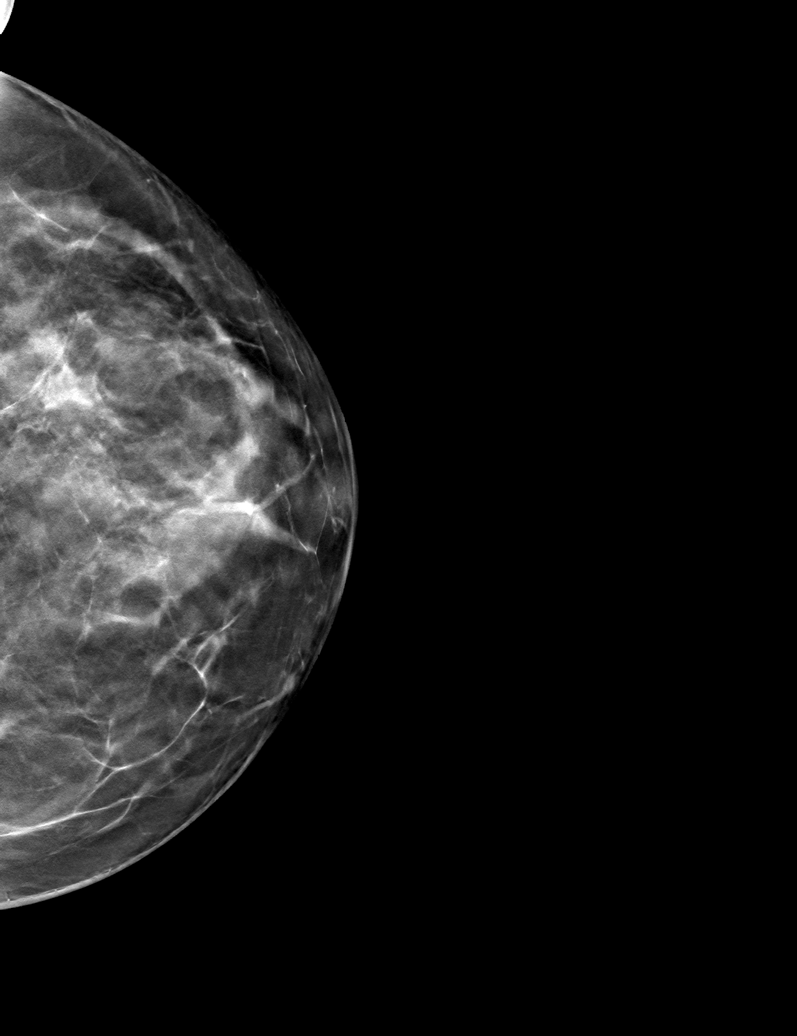

[6 of 30 positions shown; findings below may reference images not displayed]

ACR Breast Density Category c: The breast tissue is heterogeneously
dense, which may obscure small masses.
FINDINGS: There are no findings suspicious for malignancy. Images were
processed with CAD.
IMPRESSION: No mammographic evidence of malignancy. A result letter of this
screening mammogram will be mailed directly to the patient.

RECOMMENDATION:
Screening mammogram in one year. (Code:FT-U-LHB)

BI-RADS CATEGORY  1: Negative.

## 2022-05-05 NOTE — Progress Notes (Unsigned)
CARDIOLOGY CONSULT NOTE       Patient ID: Kathleen Bowen MRN: [id number] DOB/AGE: 07/10/1970 52 y.o.  Admit date: (Not on file) Referring Physician: Self Primary Physician: Patient, No Pcp Per Primary Cardiologist: New Reason for Consultation: ? Arrhythmia/ASD repair   Active Problems:   * No active hospital problems. *   HPI:  52 y.o. self referred by concern about afib. Last seen by Dr Harrington Challenger in 2015 She had and ASD repair in 1992 Was in NSR then and had some near syncope. Mother has a pacemaker TTE done 10/18/13 normal EF 55-60% no residual atrial or ventricular enlargement no shunt Holter done at same time with NSR average HR 68 rare PAC/PVCls   She has noted rapid palpitations almost daily Her apple phone suggests afib but of course no strips. She does not drink, smoke, do drugs and only a couple diet cokes/day. She is under a lot of stress Her mom is in nursing home in [place] and she has a heavy schedule as Building control surveyor   ROS All other systems reviewed and negative except as noted above  Past Medical History:  Diagnosis Date   Heart disease    Heart murmur    Hx of PTL (preterm labor), current pregnancy     Family History  Problem Relation Age of Onset   Skin cancer Sister    Liver cancer Paternal Grandfather    Alcoholism Paternal Grandfather    Heart disease Mother        PACE MAKER   Skin cancer Mother        ON NOSE   Atrial fibrillation Mother    Thrombophlebitis Daughter    ADD / ADHD Daughter    Hypertension Father    Diabetes Paternal Grandmother    Heart attack Maternal Grandmother    Heart attack Brother    ADD / ADHD Son     Social History   Socioeconomic History   Marital status: Married    Spouse name: Not on file   Number of children: Not on file   Years of education: Not on file   Highest education level: Not on file  Occupational History   Not on file  Tobacco Use   Smoking status: Never   Smokeless tobacco: Never  Vaping Use    Vaping Use: Never used  Substance and Sexual Activity   Alcohol use: No   Drug use: No   Sexual activity: Yes    Birth control/protection: Pill    Comment: LO ESTRIN 24    Other Topics Concern   Not on file  Social History Narrative   Not on file   Social Determinants of Health   Financial Resource Strain: Not on file  Food Insecurity: Not on file  Transportation Needs: Not on file  Physical Activity: Not on file  Stress: Not on file  Social Connections: Not on file  Intimate Partner Violence: Not on file    Past Surgical History:  Procedure Laterality Date   ASD Som[place]illo Right 05/14/2015   Benign   HEMORROIDECTOMY     MOHS SURGERY     PRECANCEROUS SKIN REMOVED   TONSILLECTOMY        Current Outpatient Medications:    norethindrone-ethinyl estradiol (JUNEL FE,GILDESS FE,LOESTRIN FE) 1-20 MG-MCG tablet, Take 1 tablet by mouth daily., Disp: , Rfl:     Physical Exam: Blood pressure 122/76, pulse (!) 59, height '5\' 8"'$  (1.727 m), weight 185 lb 9.6  oz (84.2 kg), SpO2 95 %.    Affect appropriate Healthy:  appears stated age 52: normal Neck supple with no adenopathy JVP normal no bruits no thyromegaly Lungs clear with no wheezing and good diaphragmatic motion Heart:  S1/S2 no murmur, no rub, gallop or click PMI normal Post ASD repair  Abdomen: benighn, BS positve, no tenderness, no AAA no bruit.  No HSM or HJR Distal pulses intact with no bruits No edema Neuro non-focal Skin warm and dry No muscular weakness   Labs:   Lab Results  Component Value Date   WBC 11.5 (H) 11/18/2016   HGB 15.1 11/18/2016   HCT 43.8 11/18/2016   MCV 92 11/18/2016   PLT 187 11/18/2016   No results for input(s): "NA", "K", "CL", "CO2", "BUN", "CREATININE", "CALCIUM", "PROT", "BILITOT", "ALKPHOS", "ALT", "AST", "GLUCOSE" in the last 168 hours.  Invalid input(s): "LABALBU" No results found for: "CKTOTAL", "CKMB", "CKMBINDEX", "TROPONINI"  Lab Results   Component Value Date   CHOL 165 11/18/2016   CHOL 177 10/17/2013   Lab Results  Component Value Date   HDL 43 11/18/2016   HDL 40.40 10/17/2013   Lab Results  Component Value Date   LDLCALC 94 11/18/2016   LDLCALC 112 (H) 10/17/2013   Lab Results  Component Value Date   TRIG 140 11/18/2016   TRIG 125.0 10/17/2013   Lab Results  Component Value Date   CHOLHDL 3.8 11/18/2016   CHOLHDL 4 10/17/2013   No results found for: "LDLDIRECT"    Radiology: No results found.  EKG: SR rate 59 normal isolated Q wave in 3   ASSESSMENT AND PLAN:   ASD Repair:  did not have any ventricular enlargement or atrial enlargement or residual shunt on TTE 2015 will update  Palpitations: monitor in 2015 rare PAC/PVC average HR 68 bpm  will repeat Start low dose Toprol    TTE 30 day monitor Toprol 25 mg   F/U after testing complete   Signed: Jenkins Rouge 05/07/2022, 3:57 PM

## 2022-05-07 ENCOUNTER — Ambulatory Visit: Payer: BC Managed Care – PPO | Attending: Cardiovascular Disease | Admitting: Cardiovascular Disease

## 2022-05-07 ENCOUNTER — Encounter: Payer: Self-pay | Admitting: Cardiovascular Disease

## 2022-05-07 VITALS — BP 122/76 | HR 59 | Ht 68.0 in | Wt 185.6 lb

## 2022-05-07 DIAGNOSIS — R002 Palpitations: Secondary | ICD-10-CM

## 2022-05-07 DIAGNOSIS — Z8774 Personal history of (corrected) congenital malformations of heart and circulatory system: Secondary | ICD-10-CM

## 2022-05-07 MED ORDER — METOPROLOL SUCCINATE ER 25 MG PO TB24: 25.0000 mg | ORAL_TABLET | Freq: Every evening | ORAL | 3 refills | Status: DC

## 2022-05-07 NOTE — Patient Instructions (Signed)
Medication Instructions:  Your physician has recommended you make the following change in your medication:  1-START metoprolol 25 mg by mouth at bedtime.  *If you need a refill on your cardiac medications before your next appointment, please call your pharmacy*  Lab Work: If you have labs (blood work) drawn today and your tests are completely normal, you will receive your results only by: [place] (if you have MyChart) OR A paper copy in the mail If you have any lab test that is abnormal or we need to change your treatment, we will call you to review the results.  Testing/Procedures: Your physician has recommended that you wear an event monitor. Event monitors are medical devices that record the heart's electrical activity. Doctors most often [place] these monitors to diagnose arrhythmias. Arrhythmias are problems with the speed or rhythm of the heartbeat. The monitor is a small, portable device. You can wear one while you do your normal daily activities. This is usually used to diagnose what is causing palpitations/syncope (passing out).  Your physician has requested that you have an echocardiogram. Echocardiography is a painless test that uses sound waves to create images of your heart. It provides your doctor with information about the size and shape of your heart and how well your heart's chambers and valves are working. This procedure takes approximately one hour. There are no restrictions for this procedure. Please do NOT wear cologne, perfume, aftershave, or lotions (deodorant is allowed). Please arrive 15 minutes prior to your appointment time.  Follow-Up: At [facility], you and your health needs are our priority.  As part of our continuing mission to provide you with exceptional heart care, we have created designated Provider Care Teams.  These Care Teams include your primary Cardiologist (physician) and Advanced Practice Providers (APPs -  Physician Assistants and Nurse  Practitioners) who all work together to provide you with the care you need, when you need it.  We recommend signing up for the patient portal called "MyChart".  Sign up information is provided on this After Visit Summary.  MyChart is used to connect with patients for Virtual Visits (Telemedicine).  Patients are able to view lab/test results, encounter notes, upcoming appointments, etc.  Non-urgent messages can be sent to your provider as well.   To learn more about what you can do with MyChart, go to [email or web address].    Your next appointment:   12 month(s)  Provider:   Jenkins Rouge, MD

## 2022-05-14 ENCOUNTER — Ambulatory Visit: Payer: BC Managed Care – PPO | Attending: Cardiovascular Disease

## 2022-05-14 DIAGNOSIS — R002 Palpitations: Secondary | ICD-10-CM | POA: Diagnosis not present

## 2022-05-14 DIAGNOSIS — Z8774 Personal history of (corrected) congenital malformations of heart and circulatory system: Secondary | ICD-10-CM

## 2022-05-21 ENCOUNTER — Telehealth: Payer: Self-pay | Admitting: Cardiovascular Disease

## 2022-05-21 ENCOUNTER — Ambulatory Visit: Payer: BC Managed Care – PPO | Attending: Cardiovascular Disease

## 2022-05-21 DIAGNOSIS — Z79899 Other long term (current) drug therapy: Secondary | ICD-10-CM

## 2022-05-21 MED ORDER — APIXABAN 5 MG PO TABS: 5.0000 mg | ORAL_TABLET | Freq: Two times a day (BID) | ORAL | 11 refills | Status: DC

## 2022-05-21 NOTE — Telephone Encounter (Signed)
Called and spoke with patient, she reports she has been having palpitations since 5:30am this morning. She reports feeling lightheaded at times.  Patient states she has not started metoprolol yet because she wanted a-fib to be picked up on the heart monitor. Advised patient to take Toprol XL as prescribed. Eliquis '5mg'$  BID sent to pharmacy. CBC, BMET ordered and lab appt scheduled for today.  Patient verbalized understanding and expressed appreciation for follow-up.     Cardiac Monitor Alert  Date of alert:  05/21/2022   Patient Name: Kathleen Bowen  DOB: 09-Apr-1970  MRN: [id number]   [place] Cardiologist: Jenkins Rouge, MD  [facility] EP:  None    Monitor Information: Cardiac Event Monitor [[employer]]  Reason:  Palpitations/A-Fib Ordering provider:  Dr. Jenkins Rouge  Alert Atrial Fibrillation/Flutter This is the 1st alert for this rhythm.  The patient has no hx of Atrial Fibrillation/Flutter.    The patient was contacted today.  She is symptomatic.  She reports the following symptoms:  palpitations since 5:30am and "feeling lightheaded at times".  Arrhythmia, symptoms and history reviewed with Dr. Jenkins Rouge (DOD).  Plan:  start Eliquis '5mg'$  BID, take Toprol XL as prescribed, get CBC and BMET.     Molli Barrows, RN  05/21/2022 9:21 AM

## 2022-05-22 ENCOUNTER — Ambulatory Visit (HOSPITAL_COMMUNITY)
Admission: RE | Admit: 2022-05-22 | Discharge: 2022-05-22 | Disposition: A | Discharge status: Home / Self Care | Payer: BC Managed Care – PPO | Source: Ambulatory Visit | Attending: Cardiovascular Disease | Admitting: Cardiovascular Disease

## 2022-05-22 DIAGNOSIS — Z8774 Personal history of (corrected) congenital malformations of heart and circulatory system: Secondary | ICD-10-CM | POA: Diagnosis not present

## 2022-05-22 DIAGNOSIS — R002 Palpitations: Secondary | ICD-10-CM | POA: Diagnosis present

## 2022-05-22 DIAGNOSIS — R001 Bradycardia, unspecified: Secondary | ICD-10-CM | POA: Insufficient documentation

## 2022-05-22 DIAGNOSIS — I4892 Unspecified atrial flutter: Secondary | ICD-10-CM | POA: Diagnosis not present

## 2022-05-22 LAB — BASIC METABOLIC PANEL
BUN/Creatinine Ratio: 14 (ref 9–23)
BUN: 14 mg/dL (ref 6–24)
CO2: 22 mmol/L (ref 20–29)
Calcium: 9.1 mg/dL (ref 8.7–10.2)
Chloride: 102 mmol/L (ref 96–106)
Creatinine, Ser: 1.01 mg/dL — ABNORMAL HIGH (ref 0.57–1.00)
Glucose: 98 mg/dL (ref 70–99)
Potassium: 4.3 mmol/L (ref 3.5–5.2)
Sodium: 137 mmol/L (ref 134–144)
eGFR: 68 mL/min/{1.73_m2} (ref >59)

## 2022-05-22 LAB — CBC
Hematocrit: 43 % (ref 34.0–46.6)
Hemoglobin: 14.7 g/dL (ref 11.1–15.9)
MCH: 31.8 pg (ref 26.6–33.0)
MCHC: 34.2 g/dL (ref 31.5–35.7)
MCV: 93 fL (ref 79–97)
Platelets: 189 10*3/uL (ref 150–450)
RBC: 4.62 x10E6/uL (ref 3.77–5.28)
RDW: 11.9 % (ref 11.7–15.4)
WBC: 7.9 10*3/uL (ref 3.4–10.8)

## 2022-05-22 LAB — ECHOCARDIOGRAM COMPLETE
AR max vel: 3.17 cm2
AV Area VTI: 2.74 cm2
AV Area mean vel: 2.64 cm2
AV Mean grad: 3 mmHg
AV Peak grad: 6.5 mmHg
Ao pk vel: 1.27 m/s
Area-P 1/2: 3.17 cm2
MV VTI: 2.54 cm2
S' Lateral: 3 cm

## 2022-05-22 NOTE — Progress Notes (Signed)
*  PRELIMINARY RESULTS* Echocardiogram 2D Echocardiogram has been performed.  Kathleen Bowen 05/22/2022, 9:06 AM

## 2022-06-05 ENCOUNTER — Other Ambulatory Visit (HOSPITAL_COMMUNITY): Payer: BC Managed Care – PPO

## 2022-06-09 ENCOUNTER — Telehealth: Payer: Self-pay

## 2022-06-09 NOTE — Telephone Encounter (Signed)
Called patient and scheduled her to see Dr. Johnsie Cancel at the end of April.

## 2022-06-09 NOTE — Telephone Encounter (Signed)
   Cardiac Monitor Alert  Date of alert:  06/09/2022   Patient Name: Kathleen Bowen  DOB: 28-Mar-1970  MRN: [id number]   [facility] Cardiologist: Jenkins Rouge, MD  [facility] EP:  None    Monitor Information: Cardiac Event Monitor [[employer]]  Reason:  Palpitations Ordering provider:  Dr Johnsie Cancel   Alert Atrial Fibrillation/Flutter This is the 2nd alert for this rhythm.  The patient has no hx of Atrial Fibrillation/Flutter.    Anticoagulation medication as of 06/09/2022           apixaban (ELIQUIS) 5 MG TABS tablet Take 1 tablet (5 mg total) by mouth 2 (two) times daily.       Next Cardiology Appointment   To be determined.   The patient was contacted today.  She is symptomatic with the episodes.  She reports the following symptoms:  palpitations, discomfort. She reports this episode lasted about a day. She was afraid to take metoprolol succinate 25 mg due to BP of 97/60.   Arrhythmia, symptoms and history reviewed with Dr Neldon Mc.  Plan:  Take Metoprolol 12.5 mg at onset of symptoms. Continue to monitor      Tor Netters, RN  06/09/2022 4:20 PM

## 2022-07-03 ENCOUNTER — Ambulatory Visit: Payer: BC Managed Care – PPO | Attending: Internal Medicine | Admitting: Internal Medicine

## 2022-07-03 ENCOUNTER — Encounter: Payer: Self-pay | Admitting: Internal Medicine

## 2022-07-03 VITALS — BP 106/68 | HR 60 | Ht 68.0 in | Wt 185.6 lb

## 2022-07-03 DIAGNOSIS — I4892 Unspecified atrial flutter: Secondary | ICD-10-CM | POA: Diagnosis not present

## 2022-07-03 DIAGNOSIS — I4891 Unspecified atrial fibrillation: Secondary | ICD-10-CM

## 2022-07-03 DIAGNOSIS — R002 Palpitations: Secondary | ICD-10-CM

## 2022-07-03 DIAGNOSIS — Z79899 Other long term (current) drug therapy: Secondary | ICD-10-CM

## 2022-07-03 MED ORDER — FLECAINIDE ACETATE 50 MG PO TABS: 50.0000 mg | ORAL_TABLET | Freq: Two times a day (BID) | ORAL | 3 refills | Status: DC

## 2022-07-03 NOTE — Progress Notes (Signed)
HPI Kathleen Bowen is referred by Dr. Eden Emms for evalaution of PAF. She is a pleasant 52 yo woman with a h/o ASD, s/p repair at 69. She had done well but developed palpitations several months ago and was found on a cardiac monitor to have PAF. She has an echo with preserved LV function, an intact ASD patch and described as normal LA and RA size. She has not had syncope although she may have some sinus node dysfunction. She has been on low dose toprol.   No Known Allergies   Current Outpatient Medications  Medication Sig Dispense Refill   apixaban (ELIQUIS) 5 MG TABS tablet Take 1 tablet (5 mg total) by mouth 2 (two) times daily. 60 tablet 11   metoprolol succinate (TOPROL XL) 25 MG 24 hr tablet Take 1 tablet (25 mg total) by mouth at bedtime. 90 tablet 3   norethindrone-ethinyl estradiol (JUNEL FE,GILDESS FE,LOESTRIN FE) 1-20 MG-MCG tablet Take 1 tablet by mouth daily.     No current facility-administered medications for this visit.     Past Medical History:  Diagnosis Date   Heart disease    Heart murmur    Hx of PTL (preterm labor), current pregnancy     ROS:   All systems reviewed and negative except as noted in the HPI.   Past Surgical History:  Procedure Laterality Date   ASD REPAIR  1992   BREAST BIOPSY Right 05/14/2015   Benign   HEMORROIDECTOMY     MOHS SURGERY     PRECANCEROUS SKIN REMOVED   TONSILLECTOMY       Family History  Problem Relation Age of Onset   Skin cancer Sister    Liver cancer Paternal Grandfather    Alcoholism Paternal Grandfather    Heart disease Mother        PACE MAKER   Skin cancer Mother        ON NOSE   Atrial fibrillation Mother    Thrombophlebitis Daughter    ADD / ADHD Daughter    Hypertension Father    Diabetes Paternal Grandmother    Heart attack Maternal Grandmother    Heart attack Brother    ADD / ADHD Son      Social History   Socioeconomic History   Marital status: Married    Spouse name: Not on file    Number of children: Not on file   Years of education: Not on file   Highest education level: Not on file  Occupational History   Not on file  Tobacco Use   Smoking status: Never   Smokeless tobacco: Never  Vaping Use   Vaping Use: Never used  Substance and Sexual Activity   Alcohol use: No   Drug use: No   Sexual activity: Yes    Birth control/protection: Pill    Comment: LO ESTRIN 24    Other Topics Concern   Not on file  Social History Narrative   Not on file   Social Determinants of Health   Financial Resource Strain: Not on file  Food Insecurity: Not on file  Transportation Needs: Not on file  Physical Activity: Not on file  Stress: Not on file  Social Connections: Not on file  Intimate Partner Violence: Not on file     BP 106/68   Pulse 60   Ht  (1.727 m)   Wt 185 lb 9.6 oz (84.2 kg)   SpO2 99%   BMI 28.22 kg/m   Physical  Exam:  Well appearing middle aged woman, NAD HEENT: Unremarkable Neck:  No JVD, no thyromegally Lymphatics:  No adenopathy Back:  No CVA tenderness Lungs:  Clear with no wheezes HEART:  Regular rate rhythm, no murmurs, no rubs, no clicks Abd:  soft, positive bowel sounds, no organomegally, no rebound, no guarding Ext:  2 plus pulses, no edema, no cyanosis, no clubbing Skin:  No rashes no nodules Neuro:  CN II through XII intact, motor grossly intact  EKG - nsr with a low atrial rhythm   Assess/Plan: PAF - I have discussed the treatment options. We discussed ablation. The presence of an ASD patch was noted. I recommended she try low dose flecainide. Her mom who is a patient maintained NSR on flecainide for almost 20 years. She will return in 2 weeks for an ECG nurse visit and with me in 3 months.  ASD - she is s/p repair and appears to be doing well.   Sharlot Gowda Dionne Knoop,MD

## 2022-07-03 NOTE — Patient Instructions (Addendum)
Medication Instructions:  Your physician has recommended you make the following change in your medication: START Taking: Flecainide 50 mg, BID Flecainide  You will- Take 1 tablet (50 mg total) by mouth 2 (two) times daily.    Lab Work: None ordered.  If you have labs (blood work) drawn today and your tests are completely normal, you will receive your results only by: MyChart Message (if you have MyChart) OR A paper copy in the mail If you have any lab test that is abnormal or we need to change your treatment, we will call you to review the results.  Testing/Procedures: None ordered.  Follow-Up: At [facility], you and your health needs are our priority.  As part of our continuing mission to provide you with exceptional heart care, we have created designated Provider Care Teams.  These Care Teams include your primary Cardiologist (physician) and Advanced Practice Providers (APPs -  Physician Assistants and Nurse Practitioners) who all work together to provide you with the care you need, when you need it.   Your next appointment:   Please schedule a 2 week RN / EKG visit for patient starting Flecainide per Dr. Lewayne Bunting.    Please schedule a 3 MONTH follow up appointment with Dr. Lewayne Bunting.   The format for your next appointment:   In Person  Provider:   Lewayne Bunting, MD{or one of the following Advanced Practice Providers on your designated Care Team:   Francis Dowse, [place] Casimiro Needle "[facility]" [place], [place]

## 2022-07-08 ENCOUNTER — Ambulatory Visit: Payer: BC Managed Care – PPO | Admitting: Cardiovascular Disease

## 2022-07-17 ENCOUNTER — Ambulatory Visit: Payer: BC Managed Care – PPO | Attending: Internal Medicine

## 2022-07-17 VITALS — HR 56 | Ht 68.0 in | Wt 190.4 lb

## 2022-07-17 DIAGNOSIS — Z79899 Other long term (current) drug therapy: Secondary | ICD-10-CM

## 2022-07-17 DIAGNOSIS — I4891 Unspecified atrial fibrillation: Secondary | ICD-10-CM | POA: Diagnosis not present

## 2022-07-17 MED ORDER — METOPROLOL SUCCINATE ER 25 MG PO TB24: 12.5000 mg | ORAL_TABLET | Freq: Every day | ORAL | 3 refills | Status: DC

## 2022-07-17 NOTE — Progress Notes (Signed)
   Nurse Visit   Date of Encounter: 07/17/2022 ID: LAVORA KRELLER, DOB 1970/09/30, MRN [id number]  PCP:  Patient, No Pcp Per   [facility] Providers Cardiologist:  Charlton Haws, MD      Visit Details   VS:  There were no vitals taken for this visit. , BMI There is no height or weight on file to calculate BMI.  Wt Readings from Last 3 Encounters:  07/03/22 185 lb 9.6 oz (84.2 kg)  05/07/22 185 lb 9.6 oz (84.2 kg)  05/27/18 198 lb 8 oz (90 kg)     Reason for visit: EKG post flecainide start Performed today: Vitals, EKG, Provider consulted:Dr. Ladona Ridgel, and Education Changes (medications, testing, etc.) : Decrease metoprolol succinate to 12.5 mg by mouth once daily.   Length of Visit: 20 minutes    Medications Adjustments/Labs and Tests Ordered: No orders of the defined types were placed in this encounter.  No orders of the defined types were placed in this encounter.    Signed, Macie Burows, RN  07/17/2022 3:10 PM

## 2022-07-17 NOTE — Patient Instructions (Signed)
Medication Instructions:  Your physician has recommended you make the following change in your medication:  DECREASE: metoprolol succinate to 12.5 mg by mouth once daily  *If you need a refill on your cardiac medications before your next appointment, please call your pharmacy*   Lab Work: NONE  If you have labs (blood work) drawn today and your tests are completely normal, you will receive your results only by: MyChart Message (if you have MyChart) OR A paper copy in the mail If you have any lab test that is abnormal or we need to change your treatment, we will call you to review the results.   Testing/Procedures: NONE   Follow-Up:As scheduled At [facility], you and your health needs are our priority.  As part of our continuing mission to provide you with exceptional heart care, we have created designated Provider Care Teams.  These Care Teams include your primary Cardiologist (physician) and Advanced Practice Providers (APPs -  Physician Assistants and Nurse Practitioners) who all work together to provide you with the care you need, when you need it.

## 2022-10-22 ENCOUNTER — Encounter: Payer: Self-pay | Admitting: Internal Medicine

## 2022-10-22 ENCOUNTER — Ambulatory Visit: Payer: BC Managed Care – PPO | Attending: Internal Medicine | Admitting: Internal Medicine

## 2022-10-22 VITALS — BP 114/62 | HR 57 | Ht 68.0 in | Wt 194.8 lb

## 2022-10-22 DIAGNOSIS — I4891 Unspecified atrial fibrillation: Secondary | ICD-10-CM

## 2022-10-22 MED ORDER — FLECAINIDE ACETATE 150 MG PO TABS: 75.0000 mg | ORAL_TABLET | Freq: Two times a day (BID) | ORAL | 3 refills | Status: DC

## 2022-10-22 NOTE — Progress Notes (Signed)
HPI Kathleen Bowen returns for ongoing evaluation of PAF. She is a pleasant 52 yo woman with a h/o ASD, s/p repair at 39. She had done well but developed palpitations several months ago and was found on a cardiac monitor to have PAF. She has an echo with preserved LV function, an intact ASD patch and described as normal LA and RA size. She has not had syncope although she may have some sinus node dysfunction. She has been on low dose toprol. I started her on low dose flecainide. She has not had much improvement. She notes symptoms about 2-3 times a month, lasting hours.  No Known Allergies   Current Outpatient Medications  Medication Sig Dispense Refill   apixaban (ELIQUIS) 5 MG TABS tablet Take 1 tablet (5 mg total) by mouth 2 (two) times daily. 60 tablet 11   flecainide (TAMBOCOR) 50 MG tablet Take 1 tablet (50 mg total) by mouth 2 (two) times daily. 90 tablet 3   metoprolol succinate (TOPROL XL) 25 MG 24 hr tablet Take 0.5 tablets (12.5 mg total) by mouth daily. 45 tablet 3   norethindrone-ethinyl estradiol (JUNEL FE,GILDESS FE,LOESTRIN FE) 1-20 MG-MCG tablet Take 1 tablet by mouth daily.     No current facility-administered medications for this visit.     Past Medical History:  Diagnosis Date   Heart disease    Heart murmur    Hx of PTL (preterm labor), current pregnancy     ROS:   All systems reviewed and negative except as noted in the HPI.   Past Surgical History:  Procedure Laterality Date   ASD REPAIR  1992   BREAST BIOPSY Right 05/14/2015   Benign   HEMORROIDECTOMY     MOHS SURGERY     PRECANCEROUS SKIN REMOVED   TONSILLECTOMY       Family History  Problem Relation Age of Onset   Skin cancer Sister    Liver cancer Paternal Grandfather    Alcoholism Paternal Grandfather    Heart disease Mother        PACE MAKER   Skin cancer Mother        ON NOSE   Atrial fibrillation Mother    Thrombophlebitis Daughter    ADD / ADHD Daughter    Hypertension Father     Diabetes Paternal Grandmother    Heart attack Maternal Grandmother    Heart attack Brother    ADD / ADHD Son      Social History   Socioeconomic History   Marital status: Married    Spouse name: Not on file   Number of children: Not on file   Years of education: Not on file   Highest education level: Not on file  Occupational History   Not on file  Tobacco Use   Smoking status: Never   Smokeless tobacco: Never  Vaping Use   Vaping status: Never Used  Substance and Sexual Activity   Alcohol use: No   Drug use: No   Sexual activity: Yes    Birth control/protection: Pill    Comment: LO ESTRIN 24    Other Topics Concern   Not on file  Social History Narrative   Not on file   Social Determinants of Health   Financial Resource Strain: Not on file  Food Insecurity: Not on file  Transportation Needs: Not on file  Physical Activity: Not on file  Stress: Not on file  Social Connections: Not on file  Intimate Partner Violence: Not  on file     BP 114/62   Pulse (!) 57   Ht 5\' 8"  (1.727 m)   Wt 194 lb 12.8 oz (88.4 kg)   SpO2 98%   BMI 29.62 kg/m   Physical Exam:  Well appearing middle aged woman, NAD HEENT: Unremarkable Neck:  No JVD, no thyromegally Lymphatics:  No adenopathy Back:  No CVA tenderness Lungs:  Clear with no wheezes HEART:  Regular rate rhythm, no murmurs, no rubs, no clicks Abd:  soft, positive bowel sounds, no organomegally, no rebound, no guarding Ext:  2 plus pulses, no edema, no cyanosis, no clubbing Skin:  No rashes no nodules Neuro:  CN II through XII intact, motor grossly intact  EKG - nsr, QRS 86.    Assess/Plan: PAF - she is still having some break through arrhythmias. I have asked her to increase the flecainide to 75 bid or 50 tid. She will return in 3 weeks for a 12 lead ECG. ASD - she is s/p repair. We will try and avoid going back thru the patch.   Sharlot Gowda Hamdan Toscano,MD

## 2022-10-22 NOTE — Patient Instructions (Addendum)
Medication Instructions:  Your physician has recommended you make the following change in your medication:  Increase flecainide to 75mg  (1/2 tablet) by mouth twice a day.  Lab Work: None ordered.  If you have labs (blood work) drawn today and your tests are completely normal, you will receive your results only by: MyChart Message (if you have MyChart) OR A paper copy in the mail If you have any lab test that is abnormal or we need to change your treatment, we will call you to review the results.  Testing/Procedures: None ordered.  Follow-Up: At [facility], you and your health needs are our priority.  As part of our continuing mission to provide you with exceptional heart care, we have created designated Provider Care Teams.  These Care Teams include your primary Cardiologist (physician) and Advanced Practice Providers (APPs -  Physician Assistants and Nurse Practitioners) who all work together to provide you with the care you need, when you need it.  Your next appointment:   Nov 2024 EKG in 3 weeks  The format for your next appointment:   In Person  Provider:   Lewayne Bunting, MD{or one of the following Advanced Practice Providers on your designated Care Team:   Francis Dowse, [place] Casimiro Needle "[facility]" [place], [place] Earnest Rosier, NP  Important Information About Sugar

## 2022-11-06 ENCOUNTER — Telehealth: Payer: Self-pay | Admitting: Internal Medicine

## 2022-11-06 NOTE — Telephone Encounter (Signed)
Patient stated she has COVID and wants to reschedule her Nurse Visit on 9/5.  Patient also stated she only started on her new medication on Monday, 8/26 and will need the visit to be 3 weeks out.

## 2022-11-06 NOTE — Telephone Encounter (Signed)
Patient has covid. Rescheduled  nurse visit. She did not increase her Flecainide until Monday so she is rescheduled for 9/16 at 3 pm

## 2022-11-13 ENCOUNTER — Ambulatory Visit: Payer: BC Managed Care – PPO

## 2022-11-24 ENCOUNTER — Ambulatory Visit: Payer: BC Managed Care – PPO | Attending: Internal Medicine

## 2022-11-24 VITALS — BP 122/74 | HR 57 | Ht 68.0 in

## 2022-11-24 DIAGNOSIS — Z79899 Other long term (current) drug therapy: Secondary | ICD-10-CM | POA: Diagnosis not present

## 2022-11-24 DIAGNOSIS — I4891 Unspecified atrial fibrillation: Secondary | ICD-10-CM

## 2022-11-24 NOTE — Patient Instructions (Signed)
Medication Instructions:  Your physician recommends that you continue on your current medications as directed. Please refer to the Current Medication list given to you today.  *If you need a refill on your cardiac medications before your next appointment, please call your pharmacy*   Lab Work: NONE If you have labs (blood work) drawn today and your tests are completely normal, you will receive your results only by: MyChart Message (if you have MyChart) OR A paper copy in the mail If you have any lab test that is abnormal or we need to change your treatment, we will call you to review the results.   Testing/Procedures: NONE   Follow-Up: At [facility], you and your health needs are our priority.  As part of our continuing mission to provide you with exceptional heart care, we have created designated Provider Care Teams.  These Care Teams include your primary Cardiologist (physician) and Advanced Practice Providers (APPs -  Physician Assistants and Nurse Practitioners) who all work together to provide you with the care you need, when you need it.  We recommend signing up for the patient portal called "MyChart".  Sign up information is provided on this After Visit Summary.  MyChart is used to connect with patients for Virtual Visits (Telemedicine).  Patients are able to view lab/test results, encounter notes, upcoming appointments, etc.  Non-urgent messages can be sent to your provider as well.   To learn more about what you can do with MyChart, go to [email or web address].    Your next appointment:   As Scheduled   Provider:   Riley Lam, MD

## 2022-11-24 NOTE — Progress Notes (Signed)
Nurse Visit   Date of Encounter: 11/24/2022 ID: Kathleen Bowen, DOB 08/12/1970, MRN [id number]  PCP:  Patient, No Pcp Per   [facility] Providers Cardiologist:  Charlton Haws, MD      Visit Details   VS:  BP 122/74 (BP Location: Left Arm, Patient Position: Sitting, Cuff Size: Normal)   Pulse (!) 57   Ht 5\' 8"  (1.727 m)   BMI 29.62 kg/m  , BMI Body mass index is 29.62 kg/m.  Wt Readings from Last 3 Encounters:  10/22/22 194 lb 12.8 oz (88.4 kg)  07/17/22 190 lb 6.4 oz (86.4 kg)  07/03/22 185 lb 9.6 oz (84.2 kg)     Reason for visit: EKG after flecainide increase Performed today: Vitals, EKG, and Provider consulted:Chandrasekhar Changes (medications, testing, etc.) : NONE Length of Visit: 15 minutes    Medications Adjustments/Labs and Tests Ordered: Orders Placed This Encounter  Procedures   EKG 12-Lead   No orders of the defined types were placed in this encounter.    Signed, Lars Mage, RN  11/24/2022 3:51 PM  No significant change on EKG on  higher dose of flecainide. In sinus.  Continue higher dose.  Riley Lam, MD FASE [facility] Cardiologist [facility]  [address] [place], [address] [place], [place] [address] [phone]  5:05 PM

## 2023-02-04 ENCOUNTER — Ambulatory Visit: Payer: BC Managed Care – PPO | Attending: Internal Medicine | Admitting: Internal Medicine

## 2023-02-04 ENCOUNTER — Encounter: Payer: Self-pay | Admitting: Internal Medicine

## 2023-02-04 VITALS — BP 110/64 | HR 53 | Ht 68.0 in | Wt 195.4 lb

## 2023-02-04 DIAGNOSIS — I480 Paroxysmal atrial fibrillation: Secondary | ICD-10-CM | POA: Diagnosis not present

## 2023-02-04 MED ORDER — FLECAINIDE ACETATE 100 MG PO TABS: 100.0000 mg | ORAL_TABLET | Freq: Two times a day (BID) | ORAL | 3 refills | Status: AC

## 2023-02-04 NOTE — Progress Notes (Signed)
HPI [name] returns for ongoing evaluation of PAF. She is a pleasant 52 yo woman with a h/o ASD, s/p repair at 45. She had done well but developed palpitations several months ago and was found on a cardiac monitor to have PAF. She has an echo with preserved LV function, an intact ASD patch and described as normal LA and RA size. She has not had syncope although she has some sinus node dysfunction. She has been on low dose toprol. I started her on low dose flecainide and we increased her dose to 75 mg twice daily when I saw her last in August. In the interim she notes that her afib is better but is still having 2-3 episodes a month each lasting 24 hours or so at a time. No Known Allergies   Current Outpatient Medications  Medication Sig Dispense Refill   apixaban (ELIQUIS) 5 MG TABS tablet Take 1 tablet (5 mg total) by mouth 2 (two) times daily. 60 tablet 11   flecainide (TAMBOCOR) 150 MG tablet Take 0.5 tablets (75 mg total) by mouth 2 (two) times daily. 45 tablet 3   metoprolol succinate (TOPROL XL) 25 MG 24 hr tablet Take 0.5 tablets (12.5 mg total) by mouth daily. 45 tablet 3   norethindrone-ethinyl estradiol (JUNEL FE,GILDESS FE,LOESTRIN FE) 1-20 MG-MCG tablet Take 1 tablet by mouth daily.     No current facility-administered medications for this visit.     Past Medical History:  Diagnosis Date   Heart disease    Heart murmur    Hx of PTL (preterm labor), current pregnancy     ROS:   All systems reviewed and negative except as noted in the HPI.   Past Surgical History:  Procedure Laterality Date   ASD REPAIR  1992   BREAST BIOPSY Right 05/14/2015   Benign   HEMORROIDECTOMY     MOHS SURGERY     PRECANCEROUS SKIN REMOVED   TONSILLECTOMY       Family History  Problem Relation Age of Onset   Skin cancer Sister    Liver cancer Paternal Grandfather    Alcoholism Paternal Grandfather    Heart disease Mother        PACE MAKER   Skin cancer Mother        ON  NOSE   Atrial fibrillation Mother    Thrombophlebitis Daughter    ADD / ADHD Daughter    Hypertension Father    Diabetes Paternal Grandmother    Heart attack Maternal Grandmother    Heart attack Brother    ADD / ADHD Son      Social History   Socioeconomic History   Marital status: Married    Spouse name: Not on file   Number of children: Not on file   Years of education: Not on file   Highest education level: Not on file  Occupational History   Not on file  Tobacco Use   Smoking status: Never   Smokeless tobacco: Never  Vaping Use   Vaping status: Never Used  Substance and Sexual Activity   Alcohol use: No   Drug use: No   Sexual activity: Yes    Birth control/protection: Pill    Comment: LO ESTRIN 26    Other Topics Concern   Not on file  Social History Narrative   Not on file   Social Determinants of Health   Financial Resource Strain: Not on file  Food Insecurity: Not on file  Transportation  Needs: Not on file  Physical Activity: Not on file  Stress: Not on file  Social Connections: Not on file  Intimate Partner Violence: Not on file     BP 110/64   Pulse (!) 53   Ht 5\' 8"  (1.727 m)   Wt 195 lb 6.4 oz (88.6 kg)   SpO2 99%   BMI 29.71 kg/m   Physical Exam:  Well appearing middle aged woman, NAD HEENT: Unremarkable Neck:  No JVD, no thyromegally Lymphatics:  No adenopathy Back:  No CVA tenderness Lungs:  Clear with no wheezes HEART:  Regular rate rhythm, no murmurs, no rubs, no clicks Abd:  soft, positive bowel sounds, no organomegally, no rebound, no guarding Ext:  2 plus pulses, no edema, no cyanosis, no clubbing Skin:  No rashes no nodules Neuro:  CN II through XII intact, motor grossly intact   DEVICE  Normal device function.  See PaceArt for details.   Assess/Plan:  PAF - she is still having some break through arrhythmias. I have asked her to increase the flecainide to 100 bid. She will return in 2-3 weeks for a 12 lead ECG. ASD -  she is s/p repair. We will try and avoid going back thru the patch for afib ablation.    Sharlot Gowda Traevon Meiring,MD

## 2023-02-04 NOTE — Patient Instructions (Signed)
Medication Instructions:  Your physician has recommended you make the following change in your medication:  Increase flecainide 100mg  twice daily.  Lab Work: None ordered.  If you have labs (blood work) drawn today and your tests are completely normal, you will receive your results only by: MyChart Message (if you have MyChart) OR A paper copy in the mail If you have any lab test that is abnormal or we need to change your treatment, we will call you to review the results.  Testing/Procedures: None ordered.  Follow-Up: At [facility], you and your health needs are our priority.  As part of our continuing mission to provide you with exceptional heart care, we have created designated Provider Care Teams.  These Care Teams include your primary Cardiologist (physician) and Advanced Practice Providers (APPs -  Physician Assistants and Nurse Practitioners) who all work together to provide you with the care you need, when you need it.   Your next appointment:   EKG in 2 weeks  The format for your next appointment:   In Person    Important Information About Sugar

## 2023-02-18 ENCOUNTER — Ambulatory Visit: Payer: BC Managed Care – PPO | Attending: Cardiology

## 2023-02-18 VITALS — HR 54 | Ht 68.0 in | Wt 197.0 lb

## 2023-02-18 DIAGNOSIS — I480 Paroxysmal atrial fibrillation: Secondary | ICD-10-CM

## 2023-02-18 NOTE — Progress Notes (Unsigned)
   Nurse Visit   Date of Encounter: 02/18/2023 ID: Kathleen Bowen, DOB 1970-05-20, MRN [id number]  PCP:  Patient, No Pcp Per   [facility] Providers Cardiologist:  Charlton Haws, MD Electrophysiologist:  Lewayne Bunting, MD { Click to update primary MD,subspecialty MD or APP then REFRESH:1}     Visit Details   VS:  Pulse (!) 54   Ht 5\' 8"  (1.727 m)   Wt 197 lb (89.4 kg)   BMI 29.95 kg/m  , BMI Body mass index is 29.95 kg/m.  Wt Readings from Last 3 Encounters:  02/18/23 197 lb (89.4 kg)  02/04/23 195 lb 6.4 oz (88.6 kg)  10/22/22 194 lb 12.8 oz (88.4 kg)     Reason for visit: EKG, post Flecainide increase to 100 mg PO BID.   Performed today: Vitals, EKG, Provider consulted:DOD Dr. Elberta Fortis, and Education Pt reports head feels foggy at times with sudden movements since increase to 100 mg. Per pt doesn't happen all the time.   Dr. Elberta Fortis suggested pt decrease to 50 mg.  Pt expressed would like to continue on 100 mg because she doesn't have as many palpitations.  If symptoms persist she will contact our office.   Changes (medications, testing, etc.) : NO changes Length of Visit: 20 minutes    Medications Adjustments/Labs and Tests Ordered: Orders Placed This Encounter  Procedures   EKG 12-Lead   No orders of the defined types were placed in this encounter.    Signed, Macie Burows, RN  02/18/2023 4:13 PM

## 2023-02-18 NOTE — Patient Instructions (Signed)
Medication Instructions:  Your physician recommends that you continue on your current medications as directed. Please refer to the Current Medication list given to you today.  *If you need a refill on your cardiac medications before your next appointment, please call your pharmacy*   Lab Work: NONE If you have labs (blood work) drawn today and your tests are completely normal, you will receive your results only by: MyChart Message (if you have MyChart) OR A paper copy in the mail If you have any lab test that is abnormal or we need to change your treatment, we will call you to review the results.   Testing/Procedures: NONE   Follow-Up:As ordered At [facility], you and your health needs are our priority.  As part of our continuing mission to provide you with exceptional heart care, we have created designated Provider Care Teams.  These Care Teams include your primary Cardiologist (physician) and Advanced Practice Providers (APPs -  Physician Assistants and Nurse Practitioners) who all work together to provide you with the care you need, when you need it.  We recommend signing up for the patient portal called "MyChart".  Sign up information is provided on this After Visit Summary.  MyChart is used to connect with patients for Virtual Visits (Telemedicine).  Patients are able to view lab/test results, encounter notes, upcoming appointments, etc.  Non-urgent messages can be sent to your provider as well.   To learn more about what you can do with MyChart, go to [email or web address].     Provider:   You may see Lewayne Bunting, MD or one of the following Advanced Practice Providers on your designated Care Team:   Francis Dowse, [place] [address] [place], [place] Sherie Don, NP Canary Brim, NP

## 2023-05-20 ENCOUNTER — Other Ambulatory Visit: Payer: Self-pay | Admitting: Cardiovascular Disease

## 2023-06-03 ENCOUNTER — Other Ambulatory Visit: Payer: Self-pay | Admitting: Cardiovascular Disease

## 2023-06-04 NOTE — Telephone Encounter (Signed)
 Prescription refill request for Eliquis received. Indication: afib  Last office visit: Kathleen Bowen 02/04/2023 Scr: 0.96, 03/10/2023 Age: 53  Weight: 89.4 kg   Refill sent.

## 2023-12-03 ENCOUNTER — Other Ambulatory Visit: Payer: Self-pay

## 2023-12-03 MED ORDER — APIXABAN 5 MG PO TABS: 5.0000 mg | ORAL_TABLET | Freq: Two times a day (BID) | ORAL | 1 refills | Status: AC

## 2023-12-03 NOTE — Telephone Encounter (Signed)
 Prescription refill request for Eliquis  received. Indication:AFIB Last office visit:11/24 Scr:0.96  12/24 Age: 53 Weight:89.4  kg  Prescription refilled

## 2024-04-14 ENCOUNTER — Other Ambulatory Visit: Payer: Self-pay | Admitting: Internal Medicine

## 2024-05-31 ENCOUNTER — Ambulatory Visit: Admitting: Student in an Organized Health Care Education/Training Program
